# Patient Record
Sex: Male | Born: 1960
Health system: Southern US, Community
[De-identification: ages and names within clinical notes are randomized; demographics above are authoritative.]

## PROBLEM LIST (undated history)

## (undated) ENCOUNTER — Emergency Department: Admission: EM | Payer: BLUE CROSS/BLUE SHIELD

## (undated) DIAGNOSIS — M1990 Unspecified osteoarthritis, unspecified site: Secondary | ICD-10-CM

## (undated) DIAGNOSIS — E785 Hyperlipidemia, unspecified: Secondary | ICD-10-CM

## (undated) DIAGNOSIS — I10 Essential (primary) hypertension: Secondary | ICD-10-CM

## (undated) DIAGNOSIS — S838X1A Sprain of other specified parts of right knee, initial encounter: Secondary | ICD-10-CM

---

## 2004-08-30 ENCOUNTER — Emergency Department (HOSPITAL_COMMUNITY): Admission: EM | Admit: 2004-08-30 | Discharge: 2004-08-30 | Payer: Self-pay | Admitting: Family Medicine

## 2004-09-13 ENCOUNTER — Emergency Department (HOSPITAL_COMMUNITY): Admission: EM | Admit: 2004-09-13 | Discharge: 2004-09-13 | Payer: Self-pay | Admitting: Family Medicine

## 2012-02-02 ENCOUNTER — Emergency Department (HOSPITAL_COMMUNITY)
Admission: EM | Admit: 2012-02-02 | Discharge: 2012-02-02 | Disposition: A | Discharge status: Home / Self Care | Payer: BC Managed Care – PPO | Attending: Emergency Medicine | Admitting: Emergency Medicine

## 2012-02-02 ENCOUNTER — Encounter (HOSPITAL_COMMUNITY): Payer: Self-pay | Admitting: *Deleted

## 2012-02-02 DIAGNOSIS — M25569 Pain in unspecified knee: Secondary | ICD-10-CM

## 2012-02-02 MED ORDER — OXYCODONE-ACETAMINOPHEN 5-325 MG PO TABS: 2.0000 | ORAL_TABLET | ORAL | Status: AC | PRN

## 2012-02-02 MED ORDER — METHOCARBAMOL 500 MG PO TABS: 500.0000 mg | ORAL_TABLET | Freq: Two times a day (BID) | ORAL | Status: AC

## 2012-02-02 MED ORDER — OXYCODONE-ACETAMINOPHEN 5-325 MG PO TABS
1.0000 | ORAL_TABLET | Freq: Once | ORAL | Status: AC
  Administered 2012-02-02: 1 via ORAL
  Filled 2012-02-02: qty 1

## 2012-02-02 MED ORDER — DIAZEPAM 5 MG PO TABS
5.0000 mg | ORAL_TABLET | Freq: Once | ORAL | Status: AC
  Administered 2012-02-02: 5 mg via ORAL
  Filled 2012-02-02: qty 1

## 2012-02-02 MED ORDER — IBUPROFEN 800 MG PO TABS
800.0000 mg | ORAL_TABLET | Freq: Once | ORAL | Status: AC
  Administered 2012-02-02: 800 mg via ORAL
  Filled 2012-02-02: qty 1

## 2012-02-02 NOTE — ED Notes (Signed)
Gave patient ice pack to place on knee. 

## 2012-02-02 NOTE — ED Provider Notes (Signed)
History     CSN: [id number]  Arrival date & time 02/02/12  4098   First MD Initiated Contact with Patient 02/02/12 [phone]      Chief Complaint  Patient presents with  . Knee Pain    left    (Consider location/radiation/quality/duration/timing/severity/associated sxs/prior treatment) Patient is a 51 y.o. male presenting with knee pain. The history is provided by the patient.  Knee Pain   patient here with sudden onset of left knee pain which is atraumatic. Denies any injury, fever, or other joint involvement. Pain is sharp and localized to his left medial knee. Worse with standing and made better with remaining still. No prior history of same.  History reviewed. No pertinent past medical history.  History reviewed. No pertinent past surgical history.  No family history on file.  History  Substance Use Topics  . Smoking status: Not on file  . Smokeless tobacco: Not on file  . Alcohol Use: Yes     occasionally      Review of Systems  All other systems reviewed and are negative.    Allergies  Review of patient's allergies indicates no known allergies.  Home Medications  No current outpatient prescriptions on file.  BP 135/78  Pulse 109  Temp(Src) 98.1 F (36.7 C) (Oral)  Resp 14  Ht 5\' 9"  (1.753 m)  Wt 197 lb (89.359 kg)  BMI 29.09 kg/m2  SpO2 96%  Physical Exam  Nursing note and vitals reviewed. Constitutional: He is oriented to person, place, and time. He appears well-developed and well-nourished.  Non-toxic appearance.  HENT:  Head: Normocephalic and atraumatic.  Eyes: Conjunctivae are normal. Pupils are equal, round, and reactive to light.  Neck: Normal range of motion.  Cardiovascular: Normal rate.   Pulmonary/Chest: Effort normal.  Musculoskeletal:       Left knee without effusion or erythema. Not warm to the touch. Pain to palpation at medial collateral ligament. No evidence of a.c. l or PCl or laxity.  Neurological: He is alert and oriented to  person, place, and time.  Skin: Skin is warm and dry.  Psychiatric: He has a normal mood and affect.    ED Course  Procedures (including critical care time)  Labs Reviewed - No data to display No results found.   No diagnosis found.    MDM  Patient given pain medication feels better he's able to walk at this time he'll be discharged        Toy Baker, MD 02/02/12 [phone]

## 2012-02-02 NOTE — ED Notes (Signed)
Patient states "I feel better and would like to go home." Advised MD.

## 2012-02-02 NOTE — ED Notes (Addendum)
C/o left knee pain onset 2200 yesterday; denies injury.

## 2012-02-02 NOTE — Discharge Instructions (Signed)
Knee Pain The knee is the complex joint between your thigh and your lower leg. It is made up of bones, tendons, ligaments, and cartilage. The bones that make up the knee are:  The femur in the thigh.   The tibia and fibula in the lower leg.   The patella or kneecap riding in the groove on the lower femur.  CAUSES  Knee pain is a common complaint with many causes. A few of these causes are:  Injury, such as:   A ruptured ligament or tendon injury.   Torn cartilage.   Medical conditions, such as:   Gout   Arthritis   Infections   Overuse, over training or overdoing a physical activity.  Knee pain can be minor or severe. Knee pain can accompany debilitating injury. Minor knee problems often respond well to self-care measures or get well on their own. More serious injuries may need medical intervention or even surgery. SYMPTOMS The knee is complex. Symptoms of knee problems can vary widely. Some of the problems are:  Pain with movement and weight bearing.   Swelling and tenderness.   Buckling of the knee.   Inability to straighten or extend your knee.   Your knee locks and you cannot straighten it.   Warmth and redness with pain and fever.   Deformity or dislocation of the kneecap.  DIAGNOSIS  Determining what is wrong may be very straight forward such as when there is an injury. It can also be challenging because of the complexity of the knee. Tests to make a diagnosis may include:  Your caregiver taking a history and doing a physical exam.   Routine X-rays can be used to rule out other problems. X-rays will not reveal a cartilage tear. Some injuries of the knee can be diagnosed by:   Arthroscopy a surgical technique by which a small video camera is inserted through tiny incisions on the sides of the knee. This procedure is used to examine and repair internal knee joint problems. Tiny instruments can be used during arthroscopy to repair the torn knee cartilage  (meniscus).   Arthrography is a radiology technique. A contrast liquid is directly injected into the knee joint. Internal structures of the knee joint then become visible on X-ray film.   An MRI scan is a non x-ray radiology procedure in which magnetic fields and a computer produce two- or three-dimensional images of the inside of the knee. Cartilage tears are often visible using an MRI scanner. MRI scans have largely replaced arthrography in diagnosing cartilage tears of the knee.   Blood work.   Examination of the fluid that helps to lubricate the knee joint (synovial fluid). This is done by taking a sample out using a needle and a syringe.  TREATMENT The treatment of knee problems depends on the cause. Some of these treatments are:  Depending on the injury, proper casting, splinting, surgery or physical therapy care will be needed.   Give yourself adequate recovery time. Do not overuse your joints. If you begin to get sore during workout routines, back off. Slow down or do fewer repetitions.   For repetitive activities such as cycling or running, maintain your strength and nutrition.   Alternate muscle groups. For example if you are a weight lifter, work the upper body on one day and the lower body the next.   Either tight or weak muscles do not give the proper support for your knee. Tight or weak muscles do not absorb the stress placed   on the knee joint. Keep the muscles surrounding the knee strong.   Take care of mechanical problems.   If you have flat feet, orthotics or special shoes may help. See your caregiver if you need help.   Arch supports, sometimes with wedges on the inner or outer aspect of the heel, can help. These can shift pressure away from the side of the knee most bothered by osteoarthritis.   A brace called an "unloader" brace also may be used to help ease the pressure on the most arthritic side of the knee.   If your caregiver has prescribed crutches, braces,  wraps or ice, use as directed. The acronym for this is PRICE. This means protection, rest, ice, compression and elevation.   Nonsteroidal anti-inflammatory drugs (NSAID's), can help relieve pain. But if taken immediately after an injury, they may actually increase swelling. Take NSAID's with food in your stomach. Stop them if you develop stomach problems. Do not take these if you have a history of ulcers, stomach pain or bleeding from the bowel. Do not take without your caregiver's approval if you have problems with fluid retention, heart failure, or kidney problems.   For ongoing knee problems, physical therapy may be helpful.   Glucosamine and chondroitin are over-the-counter dietary supplements. Both may help relieve the pain of osteoarthritis in the knee. These medicines are different from the usual anti-inflammatory drugs. Glucosamine may decrease the rate of cartilage destruction.   Injections of a corticosteroid drug into your knee joint may help reduce the symptoms of an arthritis flare-up. They may provide pain relief that lasts a few months. You may have to wait a few months between injections. The injections do have a small increased risk of infection, water retention and elevated blood sugar levels.   Hyaluronic acid injected into damaged joints may ease pain and provide lubrication. These injections may work by reducing inflammation. A series of shots may give relief for as long as 6 months.   Topical painkillers. Applying certain ointments to your skin may help relieve the pain and stiffness of osteoarthritis. Ask your pharmacist for suggestions. Many over the-counter products are approved for temporary relief of arthritis pain.   In some countries, doctors often prescribe topical NSAID's for relief of chronic conditions such as arthritis and tendinitis. A review of treatment with NSAID creams found that they worked as well as oral medications but without the serious side effects.    PREVENTION  Maintain a healthy weight. Extra pounds put more strain on your joints.   Get strong, stay limber. Weak muscles are a common cause of knee injuries. Stretching is important. Include flexibility exercises in your workouts.   Be smart about exercise. If you have osteoarthritis, chronic knee pain or recurring injuries, you may need to change the way you exercise. This does not mean you have to stop being active. If your knees ache after jogging or playing basketball, consider switching to swimming, water aerobics or other low-impact activities, at least for a few days a week. Sometimes limiting high-impact activities will provide relief.   Make sure your shoes fit well. Choose footwear that is right for your sport.   Protect your knees. Use the proper gear for knee-sensitive activities. Use kneepads when playing volleyball or laying carpet. Buckle your seat belt every time you drive. Most shattered kneecaps occur in car accidents.   Rest when you are tired.  SEEK MEDICAL CARE IF:  You have knee pain that is continual and does not   seem to be getting better.  SEEK IMMEDIATE MEDICAL CARE IF:  Your knee joint feels hot to the touch and you have a high fever. MAKE SURE YOU:   Understand these instructions.   Will watch your condition.   Will get help right away if you are not doing well or get worse.  Document Released: 09/05/2007 Document Revised: 10/28/2011 Document Reviewed: 09/05/2007 [employer] Patient Information 2012 [employer]. 

## 2012-09-14 ENCOUNTER — Ambulatory Visit (HOSPITAL_COMMUNITY)
Admission: RE | Admit: 2012-09-14 | Discharge: 2012-09-14 | Disposition: A | Discharge status: Home / Self Care | Payer: BC Managed Care – PPO | Source: Ambulatory Visit | Attending: Family Medicine | Admitting: Family Medicine

## 2012-09-14 ENCOUNTER — Other Ambulatory Visit (HOSPITAL_COMMUNITY): Payer: Self-pay | Admitting: Family Medicine

## 2012-09-14 DIAGNOSIS — M25579 Pain in unspecified ankle and joints of unspecified foot: Secondary | ICD-10-CM

## 2012-09-14 DIAGNOSIS — M79609 Pain in unspecified limb: Secondary | ICD-10-CM | POA: Insufficient documentation

## 2013-02-15 ENCOUNTER — Emergency Department (HOSPITAL_COMMUNITY): Payer: BC Managed Care – PPO

## 2013-02-15 ENCOUNTER — Encounter (HOSPITAL_COMMUNITY): Payer: Self-pay

## 2013-02-15 ENCOUNTER — Emergency Department (HOSPITAL_COMMUNITY)
Admission: EM | Admit: 2013-02-15 | Discharge: 2013-02-15 | Disposition: A | Discharge status: Home / Self Care | Payer: BC Managed Care – PPO | Attending: Emergency Medicine | Admitting: Emergency Medicine

## 2013-02-15 DIAGNOSIS — I10 Essential (primary) hypertension: Secondary | ICD-10-CM | POA: Insufficient documentation

## 2013-02-15 DIAGNOSIS — R079 Chest pain, unspecified: Secondary | ICD-10-CM

## 2013-02-15 HISTORY — DX: Essential (primary) hypertension: I10

## 2013-02-15 LAB — CBC WITH DIFFERENTIAL/PLATELET
Basophils Absolute: 0 10*3/uL (ref 0.0–0.1)
Basophils Relative: 0 % (ref 0–1)
Eosinophils Absolute: 0.1 10*3/uL (ref 0.0–0.7)
Eosinophils Relative: 2 % (ref 0–5)
HCT: 44.1 % (ref 39.0–52.0)
Hemoglobin: 15.1 g/dL (ref 13.0–17.0)
Lymphocytes Relative: 26 % (ref 12–46)
Lymphs Abs: 1.7 10*3/uL (ref 0.7–4.0)
MCH: 32 pg (ref 26.0–34.0)
MCHC: 34.2 g/dL (ref 30.0–36.0)
MCV: 93.4 fL (ref 78.0–100.0)
Monocytes Absolute: 0.3 10*3/uL (ref 0.1–1.0)
Monocytes Relative: 5 % (ref 3–12)
Neutro Abs: 4.6 10*3/uL (ref 1.7–7.7)
Neutrophils Relative %: 67 % (ref 43–77)
Platelets: ADEQUATE 10*3/uL (ref 150–400)
RBC: 4.72 MIL/uL (ref 4.22–5.81)
RDW: 13 % (ref 11.5–15.5)
WBC: 6.7 10*3/uL (ref 4.0–10.5)

## 2013-02-15 LAB — BASIC METABOLIC PANEL
BUN: 17 mg/dL (ref 6–23)
CO2: 26 mEq/L (ref 19–32)
Calcium: 9.4 mg/dL (ref 8.4–10.5)
Chloride: 100 mEq/L (ref 96–112)
Creatinine, Ser: 0.78 mg/dL (ref 0.50–1.35)
GFR calc Af Amer: >90 mL/min (ref >90)
GFR calc non Af Amer: >90 mL/min (ref >90)
Glucose, Bld: 109 mg/dL — ABNORMAL HIGH (ref 70–99)
Potassium: 4.1 mEq/L (ref 3.5–5.1)
Sodium: 136 mEq/L (ref 135–145)

## 2013-02-15 LAB — TROPONIN I: Troponin I: <0.3 ng/mL (ref <0.30)

## 2013-02-15 NOTE — ED Notes (Signed)
Pt stated that he was given given 4 aspirin and 3 sl ntg, pta by dr. Isidore Moos approx 5 min pta.

## 2013-02-15 NOTE — ED Provider Notes (Signed)
History  This chart was scribed for Mason Hutching, MD by Bennett Scrape, ED Scribe. This patient was seen in room [facility]06/[facility] and the patient's care was started at 4:00 PM.  CSN: [id number]  Arrival date & time 02/15/13  1548   First MD Initiated Contact with Patient 02/15/13 1600      Chief Complaint  Patient presents with  . Chest Pain     The history is provided by the patient. No language interpreter was used.    Mason Patterson is a 52 y.o. male who presents to the Emergency Department complaining of 3 days of gradual onset, gradually worsening, waxing and waning left lateral CP described as a soreness. He states that he played basketball 6 days ago and he thinks that. He reports that he walks for exercise but denies any recent changes in his exercise regime.He states that the pain is worse with palpation and movement of the left arm. He was sent by Dr. Phillips Odor after being evaluated in office today. He was given 4 ASA and 3 SL NTG PTA. He has a h/o HTN He denies having a h/o HLD and DM. Mother had a pacemaker placed at the age of 94 but he denies any other family h/o cardiac problems. He denies having any prior stress test or cardiac catheterizations. He denies all other associated symptoms including SOB, diaphoresis, nausea, emesis, fever and cough. He is an occasional alcohol user but denies smoking.   Past Medical History  Diagnosis Date  . Hypertension     History reviewed. No pertinent past surgical history.  No family history on file.  History  Substance Use Topics  . Smoking status: Never Smoker   . Smokeless tobacco: Not on file  . Alcohol Use: Yes     Comment: occasionally      Review of Systems  A complete 10 system review of systems was obtained and all systems are negative except as noted in the HPI and PMH.   Allergies  Review of patient's allergies indicates no known allergies.  Home Medications  No current outpatient prescriptions on  file.  Triage Vitals: BP 161/87  Pulse 97  Temp(Src) 97.9 F (36.6 C) (Oral)  Resp 20  Ht 5\' 9"  (1.753 m)  Wt 204 lb (92.534 kg)  BMI 30.11 kg/m2  SpO2 98%  Physical Exam  Nursing note and vitals reviewed. Constitutional: He is oriented to person, place, and time. He appears well-developed and well-nourished.  HENT:  Head: Normocephalic and atraumatic.  Eyes: Conjunctivae and EOM are normal. Pupils are equal, round, and reactive to light.  Neck: Normal range of motion. Neck supple.  Cardiovascular: Normal rate, regular rhythm and normal heart sounds.   Pulmonary/Chest: Effort normal and breath sounds normal.  Abdominal: Soft. Bowel sounds are normal.  Musculoskeletal: Normal range of motion.  Neurological: He is alert and oriented to person, place, and time.  Skin: Skin is warm and dry.  Psychiatric: He has a normal mood and affect.    ED Course  Procedures (including critical care time)  DIAGNOSTIC STUDIES: Oxygen Saturation is 98% on room air, normal by my interpretation.    COORDINATION OF CARE: 4:30 PM-Advised pt of his risk factors and informed pt that his symptoms do not appear to be heart related. Discussed treatment plan which includes CXR, troponin and CBC panel with pt at bedside and pt agreed to plan. Pt states that his PCP is going to set up a stress test and will refer to a  Cardiologist.  Labs Reviewed  BASIC METABOLIC PANEL - Abnormal; Notable for the following:    Glucose, Bld 109 (*)    All other components within normal limits  CBC WITH DIFFERENTIAL  TROPONIN I   Dg Chest Port 1 View  02/15/2013  *RADIOLOGY REPORT*  Clinical Data: Chest pain.  PORTABLE CHEST - 1 VIEW  Comparison: 01/07/2010.  Findings: The heart is mildly enlarged but stable.  The mediastinal and hilar contours are unchanged.  Slight prominence in the azygos region could be a slightly dilated azygos vein.  Low lung volumes with vascular crowding and basilar atelectasis.  No edema or  effusions.  The bony thorax is intact.  IMPRESSION:  1.  Low lung volumes with vascular crowding and basilar atelectasis. 2.  Borderline cardiac enlargement. 3.  Fullness in the azygos region could be a dilated azygos vein or an azygos node.  A repeat two-view chest when able is suggested.   Original Report Authenticated By: Rudie Meyer, M.D.      No diagnosis found.   Date: 02/15/2013  Rate: 98  Rhythm: normal sinus rhythm  QRS Axis: normal  Intervals: normal  ST/T Wave abnormalities: normal  Conduction Disutrbances: none  Narrative Interpretation: unremarkable      MDM  History and physical atypical for cardiac related chest pain. Screening tests normal. Cardiac risk factor profile low.   normal vital signs.      I personally performed the services described in this documentation, which was scribed in my presence. The recorded information has been reviewed and is accurate.    Mason Hutching, MD 02/15/13 [phone]

## 2013-02-15 NOTE — ED Notes (Signed)
Pt sent by dr. Lamar Blinks office chest pain for 3 days off/on, tender to palpation. Denies any n/v/d or fever, no cough, no sob.

## 2013-02-21 ENCOUNTER — Encounter (INDEPENDENT_AMBULATORY_CARE_PROVIDER_SITE_OTHER): Payer: Self-pay | Admitting: *Deleted

## 2013-12-31 ENCOUNTER — Other Ambulatory Visit (HOSPITAL_COMMUNITY): Payer: Self-pay | Admitting: Family Medicine

## 2013-12-31 ENCOUNTER — Ambulatory Visit (HOSPITAL_COMMUNITY)
Admission: RE | Admit: 2013-12-31 | Discharge: 2013-12-31 | Disposition: A | Discharge status: Home / Self Care | Payer: BC Managed Care – PPO | Source: Ambulatory Visit | Attending: Family Medicine | Admitting: Family Medicine

## 2013-12-31 DIAGNOSIS — I10 Essential (primary) hypertension: Secondary | ICD-10-CM

## 2013-12-31 DIAGNOSIS — R079 Chest pain, unspecified: Secondary | ICD-10-CM

## 2014-06-10 ENCOUNTER — Other Ambulatory Visit (HOSPITAL_COMMUNITY): Payer: Self-pay | Admitting: Family Medicine

## 2014-06-10 DIAGNOSIS — N63 Unspecified lump in unspecified breast: Secondary | ICD-10-CM

## 2014-06-11 ENCOUNTER — Ambulatory Visit (HOSPITAL_COMMUNITY): Payer: BC Managed Care – PPO

## 2014-06-18 ENCOUNTER — Ambulatory Visit (HOSPITAL_COMMUNITY)
Admission: RE | Admit: 2014-06-18 | Discharge: 2014-06-18 | Disposition: A | Discharge status: Home / Self Care | Payer: BC Managed Care – PPO | Source: Ambulatory Visit | Attending: Family Medicine | Admitting: Family Medicine

## 2014-06-18 DIAGNOSIS — N63 Unspecified lump in unspecified breast: Secondary | ICD-10-CM | POA: Insufficient documentation

## 2014-06-18 DIAGNOSIS — N644 Mastodynia: Secondary | ICD-10-CM | POA: Insufficient documentation

## 2015-07-12 ENCOUNTER — Emergency Department (HOSPITAL_COMMUNITY)
Admission: EM | Admit: 2015-07-12 | Discharge: 2015-07-12 | Disposition: A | Discharge status: Home / Self Care | Payer: BLUE CROSS/BLUE SHIELD | Attending: Emergency Medicine | Admitting: Emergency Medicine

## 2015-07-12 ENCOUNTER — Encounter (HOSPITAL_COMMUNITY): Payer: Self-pay | Admitting: Emergency Medicine

## 2015-07-12 DIAGNOSIS — Z79899 Other long term (current) drug therapy: Secondary | ICD-10-CM | POA: Insufficient documentation

## 2015-07-12 DIAGNOSIS — R000 Tachycardia, unspecified: Secondary | ICD-10-CM | POA: Insufficient documentation

## 2015-07-12 DIAGNOSIS — M1990 Unspecified osteoarthritis, unspecified site: Secondary | ICD-10-CM | POA: Insufficient documentation

## 2015-07-12 DIAGNOSIS — Z7982 Long term (current) use of aspirin: Secondary | ICD-10-CM | POA: Diagnosis not present

## 2015-07-12 DIAGNOSIS — M25562 Pain in left knee: Secondary | ICD-10-CM | POA: Diagnosis present

## 2015-07-12 DIAGNOSIS — I10 Essential (primary) hypertension: Secondary | ICD-10-CM | POA: Diagnosis not present

## 2015-07-12 HISTORY — DX: Unspecified osteoarthritis, unspecified site: M19.90

## 2015-07-12 MED ORDER — OXYCODONE-ACETAMINOPHEN 5-325 MG PO TABS: 1.0000 | ORAL_TABLET | Freq: Three times a day (TID) | ORAL | Status: DC | PRN

## 2015-07-12 MED ORDER — IBUPROFEN 400 MG PO TABS: 400.0000 mg | ORAL_TABLET | Freq: Three times a day (TID) | ORAL | Status: AC

## 2015-07-12 NOTE — ED Provider Notes (Signed)
CSN: [id number]     Arrival date & time 07/12/15  1503 History   First MD Initiated Contact with Patient 07/12/15 1512     Chief Complaint  Patient presents with  . Knee Pain     (Consider location/radiation/quality/duration/timing/severity/associated sxs/prior Treatment) Patient is a 54 y.o. male presenting with knee pain.  Knee Pain Location:  Knee Associated symptoms: no fever    54 year old male with a history of osteoporosis presents to the emergency department today for acute exacerbation of left knee pain for the last 2 days. Feel like it's sharp in nature. Not swollen, erythematous, no fever, or other systemic symptoms. No other associated symptoms. Just hurts worse with walking on it. No trauma, recent illnesses.  Past Medical History  Diagnosis Date  . Hypertension   . Arthritis    History reviewed. No pertinent past surgical history. No family history on file. Social History  Substance Use Topics  . Smoking status: Never Smoker   . Smokeless tobacco: None  . Alcohol Use: Yes     Comment: occasionally    Review of Systems  Constitutional: Negative for fever.  Musculoskeletal: Positive for gait problem ( difficult to walk in on his left knee ). Negative for joint swelling.  Skin: Negative for rash and wound.  All other systems reviewed and are negative.     Allergies  Darvocet and Strawberry  Home Medications   Prior to Admission medications   Medication Sig Start Date End Date Taking? Authorizing Provider  aspirin EC 81 MG tablet Take 243 mg by mouth once as needed for pain.    Historical Provider, MD  ibuprofen (ADVIL,MOTRIN) 400 MG tablet Take 1 tablet (400 mg total) by mouth 3 (three) times daily. 07/12/15 07/19/15  Marily Memos, MD  losartan (COZAAR) 50 MG tablet Take 50 mg by mouth daily.    Historical Provider, MD  nitroGLYCERIN (NITROSTAT) 0.4 MG SL tablet Place 0.4 mg under the tongue once as needed for chest pain.    Historical Provider, MD   oxyCODONE-acetaminophen (PERCOCET) 5-325 MG per tablet Take 1 tablet by mouth every 8 (eight) hours as needed for severe pain. 07/12/15   Marily Memos, MD   BP 174/99 mmHg  Pulse 119  Temp(Src) 97.4 F (36.3 C) (Oral)  Resp 16  Ht 5' 9.5" (1.765 m)  Wt 210 lb (95.255 kg)  BMI 30.58 kg/m2  SpO2 100% Physical Exam  Constitutional: He appears well-developed and well-nourished.  Cardiovascular: Regular rhythm.   Initially tachycardic however on my exam patient heart is in the 90s, suspect this is secondary to pain.  Pulmonary/Chest: Effort normal and breath sounds normal.  Musculoskeletal: Normal range of motion. He exhibits no edema or tenderness.  No erythema, edema, pain with palpation or pain with range of motion of left knee. No evidence of trauma.  Nursing note and vitals reviewed.   ED Course  Procedures (including critical care time) Labs Review Labs Reviewed - No data to display  Imaging Review No results found. I have personally reviewed and evaluated these images and lab results as part of my medical decision-making.   EKG Interpretation None      MDM   Final diagnoses:  Left knee pain   Acute exacerbation of nontraumatic left knee pain. No evidence for septic knee. No history of gout or other causes for symptoms. Short course pain medication provided and will follow-up with his orthopedic physician.  I have personally and contemperaneously reviewed labs and imaging and used in my  decision making as above.   A medical screening exam was performed and I feel the patient has had an appropriate workup for their chief complaint at this time and likelihood of emergent condition existing is low. They have been counseled on decision, discharge, follow up and which symptoms necessitate immediate return to the emergency department. They or their family verbally stated understanding and agreement with plan and discharged in stable condition.      Marily Memos,  MD 07/12/15 [phone]

## 2015-07-12 NOTE — ED Notes (Addendum)
Patient states he has bilateral knee pain that got worse today. Denies injury. Patient has been told before that he has arthritis.

## 2015-07-12 NOTE — ED Notes (Signed)
Pt c/o bilateral knee pain that has progressively gotten worse over the past few days, says the pain is worse when walking and more so in the left knee than the right. Pt states he has been told he has arthritis but it has "never hurt like this". Pt denies weakness in his legs.

## 2015-08-08 ENCOUNTER — Encounter (HOSPITAL_BASED_OUTPATIENT_CLINIC_OR_DEPARTMENT_OTHER): Payer: Self-pay | Admitting: *Deleted

## 2015-08-08 ENCOUNTER — Other Ambulatory Visit: Payer: Self-pay | Admitting: Orthopaedic Surgery

## 2015-08-13 ENCOUNTER — Encounter (HOSPITAL_BASED_OUTPATIENT_CLINIC_OR_DEPARTMENT_OTHER)
Admission: RE | Admit: 2015-08-13 | Discharge: 2015-08-13 | Disposition: A | Discharge status: Home / Self Care | Payer: BLUE CROSS/BLUE SHIELD | Source: Ambulatory Visit | Attending: Orthopaedic Surgery | Admitting: Orthopaedic Surgery

## 2015-08-13 DIAGNOSIS — M23261 Derangement of other lateral meniscus due to old tear or injury, right knee: Secondary | ICD-10-CM | POA: Diagnosis not present

## 2015-08-13 DIAGNOSIS — M23231 Derangement of other medial meniscus due to old tear or injury, right knee: Secondary | ICD-10-CM | POA: Diagnosis not present

## 2015-08-13 DIAGNOSIS — I10 Essential (primary) hypertension: Secondary | ICD-10-CM | POA: Diagnosis not present

## 2015-08-13 DIAGNOSIS — M23207 Derangement of unspecified meniscus due to old tear or injury, left knee: Secondary | ICD-10-CM | POA: Diagnosis not present

## 2015-08-13 DIAGNOSIS — E785 Hyperlipidemia, unspecified: Secondary | ICD-10-CM | POA: Diagnosis not present

## 2015-08-13 DIAGNOSIS — Z885 Allergy status to narcotic agent status: Secondary | ICD-10-CM | POA: Diagnosis not present

## 2015-08-13 DIAGNOSIS — E669 Obesity, unspecified: Secondary | ICD-10-CM | POA: Diagnosis not present

## 2015-08-13 DIAGNOSIS — Z91018 Allergy to other foods: Secondary | ICD-10-CM | POA: Diagnosis not present

## 2015-08-13 DIAGNOSIS — Z6829 Body mass index (BMI) 29.0-29.9, adult: Secondary | ICD-10-CM | POA: Diagnosis not present

## 2015-08-13 DIAGNOSIS — M1990 Unspecified osteoarthritis, unspecified site: Secondary | ICD-10-CM | POA: Diagnosis not present

## 2015-08-13 NOTE — Pre-Procedure Instructions (Signed)
EKG reviewed with Dr Crews. OK to proceed 

## 2015-08-13 NOTE — H&P (Signed)
[name] is an 54 y.o. male.   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 He is not working at this point due to some knee pain right greater than left. He has been seeing Dr Althea Charon and has had an injection on the left side which has helped somewhat. He persists with some terrible pain on the right and a locking sensation in that knee. The left-sided pain is just mild at this point. He cannot workout due to the discomfort. This does wake him from sleep. The pain is intermittent and severe.    MRI:  I reviewed MRI scans films and reports of a studies done at the [facility] 08/05/15. He has impressive medial meniscus tears on both sides with minimal degenerative change.  Past Medical History  Diagnosis Date  . Hypertension   . Arthritis   . Hyperlipidemia   . Acute medial meniscal injury of right knee     History reviewed. No pertinent past surgical history.  History reviewed. No pertinent family history. Social History:  reports that he has never smoked. He does not have any smokeless tobacco history on file. He reports that he does not drink alcohol or use illicit drugs.  Allergies:  Allergies  Allergen Reactions  . Darvocet [Propoxyphene N-Acetaminophen] Other (See Comments)    REACTION IS UNKNOWN  . Strawberry Rash    No prescriptions prior to admission    No results found for this or any previous visit (from the past 48 hour(s)). No results found.  Review of Systems  Musculoskeletal: Positive for joint pain.       Right knee  All other systems reviewed and are negative.   Height  (1.753 m), weight 92.987 kg (205 lb). Physical Exam  Constitutional: He is oriented to person, place, and time. He appears well-developed and well-nourished.  HENT:  Head: Normocephalic and atraumatic.  Eyes: Pupils are equal, round, and reactive to light.  Neck: Normal range of motion.  Cardiovascular:  Normal rate and regular rhythm.   Respiratory: Effort normal.  GI: Soft.  Musculoskeletal:  Right knee has trace effusion. His motion is 0-130 at which point he has some terrible pain. He has intense medial joint line pain and a very impressive McMurray's test in the medial direction. Opposite knee has a less impressive McMurray's test again with pain in the medial direction. I do not feel an effusion on the left side. Don't feel any crepitation of either knee. Hip motion is full and straight leg raise is negative. Sensation and motor function are intact in his feet with palpable pulses on both sides.  Neurological: He is alert and oriented to person, place, and time.  Skin: Skin is warm and dry.  Psychiatric: He has a normal mood and affect. His behavior is normal. Judgment and thought content normal.     Assessment/Plan Right knee torn medial meniscus by MRI  Plan: [name] has impressive medial meniscus tears on both sides. The right knee is most symptomatic. He thinks he can get by with the left knee issue. We discussed risk of anesthesia, infection, DVT related to a right knee arthroscopy. I told him he'll be on crutches for a few days after this outpatient procedure. He will require some physical therapy for recovery.   NIDA, Ginger Organ 08/13/2015, 12:52 PM

## 2015-08-14 ENCOUNTER — Encounter (HOSPITAL_BASED_OUTPATIENT_CLINIC_OR_DEPARTMENT_OTHER): Payer: Self-pay | Admitting: *Deleted

## 2015-08-14 ENCOUNTER — Encounter (HOSPITAL_BASED_OUTPATIENT_CLINIC_OR_DEPARTMENT_OTHER)
Admission: RE | Disposition: A | Discharge status: Home / Self Care | Payer: Self-pay | Source: Ambulatory Visit | Attending: Orthopaedic Surgery

## 2015-08-14 ENCOUNTER — Ambulatory Visit (HOSPITAL_BASED_OUTPATIENT_CLINIC_OR_DEPARTMENT_OTHER)
Admission: RE | Admit: 2015-08-14 | Discharge: 2015-08-14 | Disposition: A | Discharge status: Home / Self Care | Payer: BLUE CROSS/BLUE SHIELD | Source: Ambulatory Visit | Attending: Orthopaedic Surgery | Admitting: Orthopaedic Surgery

## 2015-08-14 ENCOUNTER — Ambulatory Visit (HOSPITAL_BASED_OUTPATIENT_CLINIC_OR_DEPARTMENT_OTHER): Payer: BLUE CROSS/BLUE SHIELD | Admitting: Anesthesiology

## 2015-08-14 DIAGNOSIS — M1990 Unspecified osteoarthritis, unspecified site: Secondary | ICD-10-CM | POA: Insufficient documentation

## 2015-08-14 DIAGNOSIS — Z91018 Allergy to other foods: Secondary | ICD-10-CM | POA: Insufficient documentation

## 2015-08-14 DIAGNOSIS — Z885 Allergy status to narcotic agent status: Secondary | ICD-10-CM | POA: Insufficient documentation

## 2015-08-14 DIAGNOSIS — M23231 Derangement of other medial meniscus due to old tear or injury, right knee: Secondary | ICD-10-CM | POA: Insufficient documentation

## 2015-08-14 DIAGNOSIS — I10 Essential (primary) hypertension: Secondary | ICD-10-CM | POA: Insufficient documentation

## 2015-08-14 DIAGNOSIS — E669 Obesity, unspecified: Secondary | ICD-10-CM | POA: Insufficient documentation

## 2015-08-14 DIAGNOSIS — M23261 Derangement of other lateral meniscus due to old tear or injury, right knee: Secondary | ICD-10-CM | POA: Insufficient documentation

## 2015-08-14 DIAGNOSIS — E785 Hyperlipidemia, unspecified: Secondary | ICD-10-CM | POA: Insufficient documentation

## 2015-08-14 DIAGNOSIS — M23207 Derangement of unspecified meniscus due to old tear or injury, left knee: Secondary | ICD-10-CM | POA: Insufficient documentation

## 2015-08-14 DIAGNOSIS — Z6829 Body mass index (BMI) 29.0-29.9, adult: Secondary | ICD-10-CM | POA: Insufficient documentation

## 2015-08-14 HISTORY — DX: Hyperlipidemia, unspecified: E78.5

## 2015-08-14 HISTORY — PX: CHONDROPLASTY: SHX5177

## 2015-08-14 HISTORY — PX: KNEE ARTHROSCOPY WITH MEDIAL MENISECTOMY: SHX5651

## 2015-08-14 HISTORY — DX: Sprain of other specified parts of right knee, initial encounter: S83.8X1A

## 2015-08-14 SURGERY — ARTHROSCOPY, KNEE, WITH MEDIAL MENISCECTOMY
Anesthesia: General | Site: Knee | Laterality: Right

## 2015-08-14 MED ORDER — MIDAZOLAM HCL 2 MG/2ML IJ SOLN
1.0000 mg | INTRAMUSCULAR | Status: DC | PRN
  Administered 2015-08-14: 2 mg via INTRAVENOUS

## 2015-08-14 MED ORDER — PHENYLEPHRINE HCL 10 MG/ML IJ SOLN
INTRAMUSCULAR | Status: DC | PRN
  Administered 2015-08-14 (×2): 40 ug via INTRAVENOUS

## 2015-08-14 MED ORDER — GLYCOPYRROLATE 0.2 MG/ML IJ SOLN
0.2000 mg | Freq: Once | INTRAMUSCULAR | Status: DC | PRN
Start: 2015-08-14 — End: 2015-08-14

## 2015-08-14 MED ORDER — CEFAZOLIN SODIUM-DEXTROSE 2-3 GM-% IV SOLR
INTRAVENOUS | Status: AC
  Filled 2015-08-14: qty 50

## 2015-08-14 MED ORDER — LIDOCAINE HCL (CARDIAC) 20 MG/ML IV SOLN
INTRAVENOUS | Status: DC | PRN
  Administered 2015-08-14: 50 mg via INTRAVENOUS

## 2015-08-14 MED ORDER — EPHEDRINE SULFATE 50 MG/ML IJ SOLN
INTRAMUSCULAR | Status: AC
  Filled 2015-08-14: qty 1

## 2015-08-14 MED ORDER — PROPOFOL 10 MG/ML IV BOLUS
INTRAVENOUS | Status: DC | PRN
  Administered 2015-08-14: 200 mg via INTRAVENOUS

## 2015-08-14 MED ORDER — OXYCODONE HCL 5 MG PO TABS: 5.0000 mg | ORAL_TABLET | Freq: Once | ORAL | Status: DC | PRN

## 2015-08-14 MED ORDER — KETOROLAC TROMETHAMINE 30 MG/ML IJ SOLN
INTRAMUSCULAR | Status: AC
  Filled 2015-08-14: qty 1

## 2015-08-14 MED ORDER — PHENYLEPHRINE 40 MCG/ML (10ML) SYRINGE FOR IV PUSH (FOR BLOOD PRESSURE SUPPORT)
PREFILLED_SYRINGE | INTRAVENOUS | Status: AC
  Filled 2015-08-14: qty 10

## 2015-08-14 MED ORDER — MIDAZOLAM HCL 2 MG/2ML IJ SOLN
INTRAMUSCULAR | Status: AC
  Filled 2015-08-14: qty 4

## 2015-08-14 MED ORDER — SCOPOLAMINE 1 MG/3DAYS TD PT72
1.0000 | MEDICATED_PATCH | Freq: Once | TRANSDERMAL | Status: DC | PRN
Start: 2015-08-14 — End: 2015-08-14

## 2015-08-14 MED ORDER — ONDANSETRON HCL 4 MG/2ML IJ SOLN
INTRAMUSCULAR | Status: AC
  Filled 2015-08-14: qty 2

## 2015-08-14 MED ORDER — BUPIVACAINE-EPINEPHRINE (PF) 0.5% -1:200000 IJ SOLN
INTRAMUSCULAR | Status: AC
  Filled 2015-08-14: qty 30

## 2015-08-14 MED ORDER — OXYCODONE HCL 5 MG/5ML PO SOLN: 5.0000 mg | Freq: Once | ORAL | Status: DC | PRN

## 2015-08-14 MED ORDER — MORPHINE SULFATE (PF) 4 MG/ML IV SOLN
INTRAVENOUS | Status: DC | PRN
  Administered 2015-08-14: 4 mg

## 2015-08-14 MED ORDER — LACTATED RINGERS IV SOLN
INTRAVENOUS | Status: DC
  Administered 2015-08-14: 14:00:00 via INTRAVENOUS

## 2015-08-14 MED ORDER — DEXAMETHASONE SODIUM PHOSPHATE 10 MG/ML IJ SOLN
INTRAMUSCULAR | Status: AC
  Filled 2015-08-14: qty 1

## 2015-08-14 MED ORDER — DEXAMETHASONE SODIUM PHOSPHATE 4 MG/ML IJ SOLN
INTRAMUSCULAR | Status: DC | PRN
  Administered 2015-08-14: 10 mg via INTRAVENOUS

## 2015-08-14 MED ORDER — METHYLPREDNISOLONE ACETATE 80 MG/ML IJ SUSP
INTRAMUSCULAR | Status: DC | PRN
  Administered 2015-08-14 (×2): 80 mg via INTRA_ARTICULAR

## 2015-08-14 MED ORDER — SODIUM CHLORIDE 0.9 % IR SOLN
Status: DC | PRN
  Administered 2015-08-14: 3000 mL

## 2015-08-14 MED ORDER — FENTANYL CITRATE (PF) 100 MCG/2ML IJ SOLN
INTRAMUSCULAR | Status: AC
  Filled 2015-08-14: qty 2

## 2015-08-14 MED ORDER — HYDROCODONE-ACETAMINOPHEN 5-325 MG PO TABS: 1.0000 | ORAL_TABLET | Freq: Four times a day (QID) | ORAL | Status: DC | PRN

## 2015-08-14 MED ORDER — BUPIVACAINE-EPINEPHRINE 0.5% -1:200000 IJ SOLN
INTRAMUSCULAR | Status: DC | PRN
  Administered 2015-08-14: 5 mL
  Administered 2015-08-14: 20 mL

## 2015-08-14 MED ORDER — CEFAZOLIN SODIUM-DEXTROSE 2-3 GM-% IV SOLR
2.0000 g | INTRAVENOUS | Status: AC
  Administered 2015-08-14: 2 g via INTRAVENOUS

## 2015-08-14 MED ORDER — MORPHINE SULFATE (PF) 4 MG/ML IV SOLN
INTRAVENOUS | Status: AC
  Filled 2015-08-14: qty 1

## 2015-08-14 MED ORDER — EPHEDRINE SULFATE 50 MG/ML IJ SOLN
INTRAMUSCULAR | Status: DC | PRN
  Administered 2015-08-14 (×2): 10 mg via INTRAVENOUS

## 2015-08-14 MED ORDER — PROPOFOL 10 MG/ML IV BOLUS
INTRAVENOUS | Status: AC
  Filled 2015-08-14: qty 20

## 2015-08-14 MED ORDER — LIDOCAINE HCL (CARDIAC) 20 MG/ML IV SOLN
INTRAVENOUS | Status: AC
  Filled 2015-08-14: qty 5

## 2015-08-14 MED ORDER — CHLORHEXIDINE GLUCONATE 4 % EX LIQD: 60.0000 mL | Freq: Once | CUTANEOUS | Status: DC

## 2015-08-14 MED ORDER — FENTANYL CITRATE (PF) 100 MCG/2ML IJ SOLN
50.0000 ug | INTRAMUSCULAR | Status: DC | PRN
  Administered 2015-08-14: 100 ug via INTRAVENOUS

## 2015-08-14 MED ORDER — FENTANYL CITRATE (PF) 100 MCG/2ML IJ SOLN
25.0000 ug | INTRAMUSCULAR | Status: DC | PRN
  Administered 2015-08-14: 50 ug via INTRAVENOUS
  Administered 2015-08-14 (×2): 25 ug via INTRAVENOUS

## 2015-08-14 MED ORDER — FENTANYL CITRATE (PF) 100 MCG/2ML IJ SOLN
INTRAMUSCULAR | Status: AC
  Filled 2015-08-14: qty 4

## 2015-08-14 MED ORDER — PROMETHAZINE HCL 25 MG/ML IJ SOLN: 6.2500 mg | INTRAMUSCULAR | Status: DC | PRN

## 2015-08-14 MED ORDER — LACTATED RINGERS IV SOLN: INTRAVENOUS | Status: DC

## 2015-08-14 SURGICAL SUPPLY — 35 items
BANDAGE ELASTIC 6 VELCRO ST LF (GAUZE/BANDAGES/DRESSINGS) ×2 IMPLANT
BLADE CUDA 5.5 (BLADE) IMPLANT
BLADE CUTTER GATOR 3.5 (BLADE) ×2 IMPLANT
BLADE GREAT WHITE 4.2 (BLADE) IMPLANT
BNDG GAUZE ELAST 4 BULKY (GAUZE/BANDAGES/DRESSINGS) ×2 IMPLANT
DRAPE ARTHROSCOPY W/POUCH 114 (DRAPES) ×2 IMPLANT
DRAPE U-SHAPE 47X51 STRL (DRAPES) ×2 IMPLANT
DRSG EMULSION OIL 3X3 NADH (GAUZE/BANDAGES/DRESSINGS) ×2 IMPLANT
DURAPREP 26ML APPLICATOR (WOUND CARE) ×2 IMPLANT
ELECT MENISCUS 165MM 90D (ELECTRODE) IMPLANT
ELECT REM PT RETURN 9FT ADLT (ELECTROSURGICAL)
ELECTRODE REM PT RTRN 9FT ADLT (ELECTROSURGICAL) IMPLANT
GAUZE SPONGE 4X4 12PLY STRL (GAUZE/BANDAGES/DRESSINGS) ×2 IMPLANT
GLOVE BIO SURGEON STRL SZ8 (GLOVE) ×4 IMPLANT
GLOVE BIOGEL PI IND STRL 8 (GLOVE) ×2 IMPLANT
GLOVE BIOGEL PI INDICATOR 8 (GLOVE) ×2
GOWN STRL REUS W/ TWL LRG LVL3 (GOWN DISPOSABLE) ×1 IMPLANT
GOWN STRL REUS W/ TWL XL LVL3 (GOWN DISPOSABLE) ×2 IMPLANT
GOWN STRL REUS W/TWL LRG LVL3 (GOWN DISPOSABLE) ×2
GOWN STRL REUS W/TWL XL LVL3 (GOWN DISPOSABLE) ×4
IV NS IRRIG 3000ML ARTHROMATIC (IV SOLUTION) IMPLANT
KNEE WRAP E Z 3 GEL PACK (MISCELLANEOUS) ×2 IMPLANT
MANIFOLD NEPTUNE II (INSTRUMENTS) IMPLANT
PACK ARTHROSCOPY DSU (CUSTOM PROCEDURE TRAY) ×2 IMPLANT
PACK BASIN DAY SURGERY FS (CUSTOM PROCEDURE TRAY) ×2 IMPLANT
PENCIL BUTTON HOLSTER BLD 10FT (ELECTRODE) IMPLANT
SET ARTHROSCOPY TUBING (MISCELLANEOUS) ×2
SET ARTHROSCOPY TUBING LN (MISCELLANEOUS) ×1 IMPLANT
SHEET MEDIUM DRAPE 40X70 STRL (DRAPES) ×2 IMPLANT
SYR 3ML 18GX1 1/2 (SYRINGE) IMPLANT
TOWEL OR 17X24 6PK STRL BLUE (TOWEL DISPOSABLE) ×2 IMPLANT
TOWEL OR NON WOVEN STRL DISP B (DISPOSABLE) ×2 IMPLANT
WAND 30 DEG SABER W/CORD (SURGICAL WAND) IMPLANT
WAND STAR VAC 90 (SURGICAL WAND) IMPLANT
WATER STERILE IRR 1000ML POUR (IV SOLUTION) ×2 IMPLANT

## 2015-08-14 NOTE — Anesthesia Preprocedure Evaluation (Signed)
Anesthesia Evaluation  Patient identified by MRN, date of birth, ID band Patient awake    Reviewed: Allergy & Precautions, H&P , NPO status , Patient's Chart, lab work & pertinent test results  History of Anesthesia Complications Negative for: history of anesthetic complications  Airway Mallampati: II  TM Distance: >3 FB Neck ROM: full    Dental no notable dental hx.    Pulmonary neg pulmonary ROS,    Pulmonary exam normal breath sounds clear to auscultation       Cardiovascular hypertension, Pt. on medications Normal cardiovascular exam Rhythm:regular Rate:Normal     Neuro/Psych negative neurological ROS     GI/Hepatic negative GI ROS, Neg liver ROS,   Endo/Other  negative endocrine ROS  Renal/GU negative Renal ROS     Musculoskeletal  (+) Arthritis ,   Abdominal (+) + obese,   Peds  Hematology negative hematology ROS (+)   Anesthesia Other Findings Medial Meniscus tear  Reproductive/Obstetrics negative OB ROS                             Anesthesia Physical Anesthesia Plan  ASA: II  Anesthesia Plan: General   Post-op Pain Management:    Induction: Intravenous  Airway Management Planned: LMA  Additional Equipment:   Intra-op Plan:   Post-operative Plan: Extubation in OR  Informed Consent: I have reviewed the patients History and Physical, chart, labs and discussed the procedure including the risks, benefits and alternatives for the proposed anesthesia with the patient or authorized representative who has indicated his/her understanding and acceptance.   Dental Advisory Given  Plan Discussed with: Anesthesiologist, CRNA and Surgeon  Anesthesia Plan Comments:         Anesthesia Quick Evaluation

## 2015-08-14 NOTE — Transfer of Care (Signed)
Immediate Anesthesia Transfer of Care Note  Patient: Mason Patterson  Procedure(s) Performed: Procedure(s): ARTHROSCOPY RIGHT KNEE (Right)  Patient Location: PACU  Anesthesia Type:General  Level of Consciousness: sedated  Airway & Oxygen Therapy: Patient Spontanous Breathing and Patient connected to face mask oxygen  Post-op Assessment: Report given to RN and Post -op Vital signs reviewed and stable  Post vital signs: Reviewed and stable  Last Vitals:  Filed Vitals:   08/14/15 1356  BP: 137/74  Pulse: 84  Temp: 36.5 C  Resp: 20    Complications: No apparent anesthesia complications

## 2015-08-14 NOTE — Anesthesia Postprocedure Evaluation (Signed)
  Anesthesia Post-op Note  Patient: Mason Patterson  Procedure(s) Performed: Procedure(s) (LRB): KNEE ARTHROSCOPY WITH MEDIAL AND LATERAL MENISECTOMY (Right) CHONDROPLASTY (Right)  Patient Location: PACU  Anesthesia Type: General  Level of Consciousness: awake and alert   Airway and Oxygen Therapy: Patient Spontanous Breathing  Post-op Pain: mild  Post-op Assessment: Post-op Vital signs reviewed, Patient's Cardiovascular Status Stable, Respiratory Function Stable, Patent Airway and No signs of Nausea or vomiting  Last Vitals:  Filed Vitals:   08/14/15 1545  BP: 128/82  Pulse: 94  Temp:   Resp: 15    Post-op Vital Signs: stable   Complications: No apparent anesthesia complications

## 2015-08-14 NOTE — Interval H&P Note (Signed)
OK for surgery PD 

## 2015-08-14 NOTE — Anesthesia Procedure Notes (Signed)
Procedure Name: LMA Insertion Date/Time: 08/14/2015 2:50 PM Performed by: Caren Macadam Pre-anesthesia Checklist: Patient identified, Emergency Drugs available, Suction available and Patient being monitored Patient Re-evaluated:Patient Re-evaluated prior to inductionOxygen Delivery Method: Circle System Utilized Preoxygenation: Pre-oxygenation with 100% oxygen Intubation Type: IV induction Ventilation: Mask ventilation without difficulty LMA: LMA inserted LMA Size: 5.0 Number of attempts: 1 Airway Equipment and Method: Bite block Placement Confirmation: positive ETCO2 and breath sounds checked- equal and bilateral Tube secured with: Tape Dental Injury: Teeth and Oropharynx as per pre-operative assessment

## 2015-08-14 NOTE — Op Note (Signed)
#  [id number] 

## 2015-08-14 NOTE — Discharge Instructions (Signed)

## 2015-08-15 ENCOUNTER — Encounter (HOSPITAL_BASED_OUTPATIENT_CLINIC_OR_DEPARTMENT_OTHER): Payer: Self-pay | Admitting: Orthopaedic Surgery

## 2015-08-15 NOTE — Op Note (Signed)
NAMEYADRIEL, KERRIGAN            ACCOUNT NO.:  [id number]  MEDICAL RECORD NO.:  [id number]  LOCATION:                               FACILITY:  [facility]  PHYSICIAN:  Lubertha Basque. Dalldorf, M.D.DATE OF BIRTH:  03/20/1961  DATE OF PROCEDURE:  08/14/2015 DATE OF DISCHARGE:  08/14/2015                              OPERATIVE REPORT   PREOPERATIVE DIAGNOSES: 1. Right knee torn medial and torn lateral meniscus. 2. Right knee degenerative joint disease. 3. Left knee torn meniscus.  POSTOPERATIVE DIAGNOSES: 1. Right knee torn medial and torn lateral meniscus. 2. Right knee degenerative joint disease. 3. Left knee torn meniscus.  PROCEDURES: 1. Right knee partial medial and partial lateral meniscectomy. 2. Right knee abrasion chondroplasty of patellofemoral and medial. 3. Left knee injection.  ANESTHESIA:  General.  ATTENDING SURGEON:  Lubertha Basque. Jerl Santos, M.D.  ASSISTANT:  Elodia Florence, P.A.  INDICATION FOR PROCEDURE:  The patient is a 54 year old man with a history of bilateral knee pain.  He was treated with an injection on the left and it is somewhat better, but persists with right-sided pain.  He has MRI scan evidence of meniscus injuries on both sides, but the right is his most symptomatic.  This keeps him from resting and being able to participate and exercise and walking.  He was offered an arthroscopy on the right.  Informed operative consent was obtained after discussion of possible complications including reaction to anesthesia and infection.  SUMMARY OF FINDINGS AND PROCEDURE:  Under general anesthesia, we first performed an injection of the left knee at his request.  We then performed the arthroscopy on the right knee.  Suprapatellar pouch was benign while the patellofemoral joint exhibited some broad areas of grade 4 change to the intertrochlear groove.  A thorough chondroplasty was done along with abrasion and bleeding bone in some small areas. Medial compartment exhibited  a displaced tear of the medial meniscus which was flipped anteriorly.  I performed about 25% partial medial meniscectomy.  He also had some grade 3 and very focal grade 4 change in this compartment.  ACL was normal.  Lateral compartment exhibited a small radial tear of the middle horn and about 5% partial lateral meniscectomy was done.  He had no degenerative changes in this compartment.  He was injected with the usual agents plus Depo-Medrol and discharged home.  DESCRIPTION OF PROCEDURE:  The patient was taken to the operating suite where general anesthetic was applied without difficulty.  He was positioned supine.  After sterile prep, we injected the left knee and placed a Band-Aid.  He was then prepped and draped in normal sterile fashion.  After administration of IV Kefzol and an appropriate time-out, an arthroscopy of the right knee was performed through total of 2 portals.  Findings were as noted above and procedure consisted mostly of the partial medial meniscectomy which was done with basket and shaver. I then performed a chondroplasty which was described above.  The knee was thoroughly irrigated followed by the placement of Marcaine with epinephrine and morphine plus some Depo-Medrol.  Adaptic was placed over the portals followed by dry gauze and loose Ace wrap.  Estimated blood loss and fluids obtained from Anesthesia  records.  DISPOSITION:  The patient was extubated in the operating room and taken to the recovery room in stable addition.  He was to go home same-day and follow up in the office closely.  I will contact him by phone tonight.     Lubertha Basque Jerl Santos, M.D.     PGD/MEDQ  D:  08/14/2015  T:  08/14/2015  Job:  [id number]

## 2015-09-19 ENCOUNTER — Ambulatory Visit (INDEPENDENT_AMBULATORY_CARE_PROVIDER_SITE_OTHER): Payer: BLUE CROSS/BLUE SHIELD | Admitting: Urology

## 2015-09-19 DIAGNOSIS — R3121 Asymptomatic microscopic hematuria: Secondary | ICD-10-CM

## 2015-09-19 DIAGNOSIS — N5201 Erectile dysfunction due to arterial insufficiency: Secondary | ICD-10-CM | POA: Diagnosis not present

## 2015-09-19 DIAGNOSIS — N401 Enlarged prostate with lower urinary tract symptoms: Secondary | ICD-10-CM | POA: Diagnosis not present

## 2015-09-19 DIAGNOSIS — R3912 Poor urinary stream: Secondary | ICD-10-CM | POA: Diagnosis not present

## 2015-11-21 ENCOUNTER — Ambulatory Visit (INDEPENDENT_AMBULATORY_CARE_PROVIDER_SITE_OTHER): Payer: BLUE CROSS/BLUE SHIELD | Admitting: Urology

## 2015-11-21 DIAGNOSIS — R3912 Poor urinary stream: Secondary | ICD-10-CM | POA: Diagnosis not present

## 2015-11-21 DIAGNOSIS — R3121 Asymptomatic microscopic hematuria: Secondary | ICD-10-CM | POA: Diagnosis not present

## 2015-11-21 DIAGNOSIS — N5201 Erectile dysfunction due to arterial insufficiency: Secondary | ICD-10-CM | POA: Diagnosis not present

## 2015-11-21 DIAGNOSIS — N401 Enlarged prostate with lower urinary tract symptoms: Secondary | ICD-10-CM

## 2015-11-23 HISTORY — PX: KNEE SURGERY: SHX244

## 2016-09-02 NOTE — Progress Notes (Signed)
Cardiology Office Note   Date:  09/03/2016   ID:  Mason Patterson, DOB 03-28-1961, MRN [id number]  PCP:  Colette RibasGOLDING, JOHN CABOT, MD  Cardiologist:   Charlton HawsPeter Keller Mikels, MD   No chief complaint on file.     History of Present Illness: Mason Patterson is a 55 y.o. male who presents for evaluation of abnormal ECG.  Coronary Risk factors HTN and elevated lipids Noted ST elevation in precordial leads on ECG V 234  Reviewed ECG from 08/13/16 and benign with early reopol/ J point elevation in V234 However the J point elevation was more prominent in V34 than ECG;s from 2014 and 2016   [place] No smoking , ETOH or drugs  Engineer, manufacturing systemsees chiropractor for back pain Sedentary but starting to exercise more for back No chest pain or signs of pericarditis.  He has a soft tissue mass in left pectoral area that gets tender from time to time   Labs reviewed:  TC 236 HDL 47 LDL 176 Tri 66 07/24/15    Past Medical History:  Diagnosis Date  . Acute medial meniscal injury of right knee   . Arthritis   . Hyperlipidemia   . Hypertension     Past Surgical History:  Procedure Laterality Date  . CHONDROPLASTY Right 08/14/2015   Procedure: CHONDROPLASTY;  Surgeon: Marcene CorningPeter Dalldorf, MD;  Location: [facility];  Service: Orthopedics;  Laterality: Right;  . KNEE ARTHROSCOPY WITH MEDIAL MENISECTOMY Right 08/14/2015   Procedure: KNEE ARTHROSCOPY WITH MEDIAL AND LATERAL MENISECTOMY;  Surgeon: Marcene CorningPeter Dalldorf, MD;  Location: [facility];  Service: Orthopedics;  Laterality: Right;     Current Outpatient Prescriptions  Medication Sig Dispense Refill  . losartan (COZAAR) 50 MG tablet Take 50 mg by mouth daily.     No current facility-administered medications for this visit.     Allergies:   Darvocet [propoxyphene n-acetaminophen] and Strawberry extract    Social History:  The patient  reports that he has never smoked. He has never  used smokeless tobacco. He reports that he does not drink alcohol or use drugs.   Family History:  The patient's family history includes Cancer in his brother and brother; Diabetes in his mother.    ROS:  Please see the history of present illness.   Otherwise, review of systems are positive for none.   All other systems are reviewed and negative.    PHYSICAL EXAM: VS:  BP 130/80   Pulse 95   Ht 5\' 8"  (1.727 m)   Wt 94.3 kg (208 lb)   SpO2 98%   BMI 31.63 kg/m  , BMI Body mass index is 31.63 kg/m. Affect appropriate Healthy:  appears stated age HEENT: normal Neck supple with no adenopathy JVP normal no bruits no thyromegaly Lungs clear with no wheezing and good diaphragmatic motion Heart:  S1/S2 no murmur, no rub, gallop or click PMI normal Abdomen: benighn, BS positve, no tenderness, no AAA no bruit.  No HSM or HJR Distal pulses intact with no bruits No edema Neuro non-focal Skin warm and dry No muscular weakness    EKG:  SEE HPI  J point elevation V234 07/2016 compared to 2016 and 2014 minor change    Recent Labs: No results found for requested labs within last 8760 hours.    Lipid Panel No results found for: CHOL, TRIG, HDL, CHOLHDL, VLDL, LDLCALC, LDLDIRECT    Wt Readings from Last 3 Encounters:  09/03/16 94.3 kg (208 lb)  08/14/15 90.3 kg (199 lb)  07/12/15 95.3 kg (210 lb)      Other studies Reviewed: Additional studies/ records that were reviewed today include: Notes and ECG from primary Mamogram in Epic .    ASSESSMENT AND PLAN:  1.  Abnormal ECG:  Benign normal variant for him f/u echo for LV size and function no clinical history to  Suspect pericarditis or cAD 2. Breast: I cannot palpate mass and mamogram with benign fatty tissue 3. Lumbago:  F/u chiropractor discussed core exercises  4. HTN:  Well controlled.  Continue current medications and low sodium Dash type diet.      Current medicines are reviewed at length with the patient today.   The patient does not have concerns regarding medicines.  The following changes have been made:  no change  Labs/ tests ordered today include: Echo   Orders Placed This Encounter  Procedures  . ECHOCARDIOGRAM COMPLETE     Disposition:   FU with [place] PRN      Signed, Charlton Haws, MD  09/03/2016 2:53 PM    [facility] [address] [place], [place], [place]  [address] Phone: [phone]; Fax: [phone]

## 2016-09-03 ENCOUNTER — Encounter: Payer: Self-pay | Admitting: Cardiovascular Disease

## 2016-09-03 ENCOUNTER — Ambulatory Visit (INDEPENDENT_AMBULATORY_CARE_PROVIDER_SITE_OTHER): Payer: BLUE CROSS/BLUE SHIELD | Admitting: Cardiovascular Disease

## 2016-09-03 VITALS — BP 130/80 | HR 95 | Ht 68.0 in | Wt 208.0 lb

## 2016-09-03 DIAGNOSIS — R9431 Abnormal electrocardiogram [ECG] [EKG]: Secondary | ICD-10-CM

## 2016-09-03 NOTE — Patient Instructions (Signed)
Your physician recommends that you continue on your current medications as directed. Please refer to the Current Medication list given to you today.   Your physician has requested that you have an echocardiogram. Echocardiography is a painless test that uses sound waves to create images of your heart. It provides your doctor with information about the size and shape of your heart and how well your heart's chambers and valves are working. This procedure takes approximately one hour. There are no restrictions for this procedure.  Your physician recommends that you schedule a follow-up appointment in: AS NEEDED  Thanks for choosing [facility]!!!

## 2016-09-20 ENCOUNTER — Ambulatory Visit (HOSPITAL_COMMUNITY): Payer: BLUE CROSS/BLUE SHIELD

## 2017-01-14 ENCOUNTER — Ambulatory Visit (INDEPENDENT_AMBULATORY_CARE_PROVIDER_SITE_OTHER): Payer: BLUE CROSS/BLUE SHIELD | Admitting: Urology

## 2017-01-14 DIAGNOSIS — R3121 Asymptomatic microscopic hematuria: Secondary | ICD-10-CM

## 2017-01-14 DIAGNOSIS — N5201 Erectile dysfunction due to arterial insufficiency: Secondary | ICD-10-CM

## 2017-01-14 DIAGNOSIS — N401 Enlarged prostate with lower urinary tract symptoms: Secondary | ICD-10-CM

## 2017-01-14 DIAGNOSIS — B379 Candidiasis, unspecified: Secondary | ICD-10-CM | POA: Diagnosis not present

## 2017-01-14 DIAGNOSIS — R3912 Poor urinary stream: Secondary | ICD-10-CM

## 2017-08-15 ENCOUNTER — Encounter (HOSPITAL_COMMUNITY): Payer: Self-pay

## 2017-08-15 ENCOUNTER — Emergency Department (HOSPITAL_COMMUNITY)
Admission: EM | Admit: 2017-08-15 | Discharge: 2017-08-15 | Disposition: A | Discharge status: Home / Self Care | Payer: BLUE CROSS/BLUE SHIELD | Attending: Emergency Medicine | Admitting: Emergency Medicine

## 2017-08-15 DIAGNOSIS — Z79899 Other long term (current) drug therapy: Secondary | ICD-10-CM | POA: Diagnosis not present

## 2017-08-15 DIAGNOSIS — S3992XA Unspecified injury of lower back, initial encounter: Secondary | ICD-10-CM | POA: Diagnosis present

## 2017-08-15 DIAGNOSIS — Y9389 Activity, other specified: Secondary | ICD-10-CM | POA: Insufficient documentation

## 2017-08-15 DIAGNOSIS — Y92410 Unspecified street and highway as the place of occurrence of the external cause: Secondary | ICD-10-CM | POA: Diagnosis not present

## 2017-08-15 DIAGNOSIS — Y999 Unspecified external cause status: Secondary | ICD-10-CM | POA: Diagnosis not present

## 2017-08-15 DIAGNOSIS — S161XXA Strain of muscle, fascia and tendon at neck level, initial encounter: Secondary | ICD-10-CM | POA: Diagnosis not present

## 2017-08-15 DIAGNOSIS — I10 Essential (primary) hypertension: Secondary | ICD-10-CM | POA: Diagnosis not present

## 2017-08-15 DIAGNOSIS — S39012A Strain of muscle, fascia and tendon of lower back, initial encounter: Secondary | ICD-10-CM | POA: Diagnosis not present

## 2017-08-15 MED ORDER — IBUPROFEN 600 MG PO TABS: 600.0000 mg | ORAL_TABLET | Freq: Four times a day (QID) | ORAL | 0 refills | Status: DC

## 2017-08-15 MED ORDER — CYCLOBENZAPRINE HCL 10 MG PO TABS: 10.0000 mg | ORAL_TABLET | Freq: Three times a day (TID) | ORAL | 0 refills | Status: DC
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
## 2017-08-15 NOTE — Discharge Instructions (Signed)
Your vital signs are within normal limits. Your exam suggest cervical and lumbar strain. Please rest as much as possible. Please use ibuprofen with each meal and at bed time. Use flexeril for spasm pain. This medication may cause drowsiness. Please do not drink, drive, or participate in activity that requires concentration while taking this medication. Please see your primary MD or return to the ED if any changes or problems.

## 2018-02-10 ENCOUNTER — Ambulatory Visit (INDEPENDENT_AMBULATORY_CARE_PROVIDER_SITE_OTHER): Payer: BLUE CROSS/BLUE SHIELD | Admitting: Urology

## 2018-02-10 DIAGNOSIS — R3912 Poor urinary stream: Secondary | ICD-10-CM

## 2018-02-10 DIAGNOSIS — N401 Enlarged prostate with lower urinary tract symptoms: Secondary | ICD-10-CM | POA: Diagnosis not present

## 2018-02-10 DIAGNOSIS — N5201 Erectile dysfunction due to arterial insufficiency: Secondary | ICD-10-CM

## 2018-03-07 ENCOUNTER — Encounter: Payer: Self-pay | Admitting: Family Medicine

## 2018-03-23 ENCOUNTER — Encounter (HOSPITAL_COMMUNITY): Payer: Self-pay

## 2018-03-23 ENCOUNTER — Other Ambulatory Visit: Payer: Self-pay

## 2018-03-23 ENCOUNTER — Emergency Department (HOSPITAL_COMMUNITY): Payer: BLUE CROSS/BLUE SHIELD

## 2018-03-23 ENCOUNTER — Emergency Department (HOSPITAL_COMMUNITY)
Admission: EM | Admit: 2018-03-23 | Discharge: 2018-03-23 | Disposition: A | Discharge status: Home / Self Care | Payer: BLUE CROSS/BLUE SHIELD | Attending: Emergency Medicine | Admitting: Emergency Medicine

## 2018-03-23 DIAGNOSIS — I10 Essential (primary) hypertension: Secondary | ICD-10-CM | POA: Insufficient documentation

## 2018-03-23 DIAGNOSIS — R1032 Left lower quadrant pain: Secondary | ICD-10-CM | POA: Diagnosis not present

## 2018-03-23 DIAGNOSIS — K5792 Diverticulitis of intestine, part unspecified, without perforation or abscess without bleeding: Secondary | ICD-10-CM

## 2018-03-23 DIAGNOSIS — Z79899 Other long term (current) drug therapy: Secondary | ICD-10-CM | POA: Diagnosis not present

## 2018-03-23 LAB — URINALYSIS, ROUTINE W REFLEX MICROSCOPIC
Bacteria, UA: NONE SEEN
Bilirubin Urine: NEGATIVE
Glucose, UA: NEGATIVE mg/dL
Ketones, ur: NEGATIVE mg/dL
Leukocytes, UA: NEGATIVE
Nitrite: NEGATIVE
Protein, ur: NEGATIVE mg/dL
Specific Gravity, Urine: 1.025 (ref 1.005–1.030)
pH: 5 (ref 5.0–8.0)

## 2018-03-23 LAB — CBC
HCT: 39.5 % (ref 39.0–52.0)
Hemoglobin: 13.4 g/dL (ref 13.0–17.0)
MCH: 31.9 pg (ref 26.0–34.0)
MCHC: 33.9 g/dL (ref 30.0–36.0)
MCV: 94 fL (ref 78.0–100.0)
Platelets: 343 10*3/uL (ref 150–400)
RBC: 4.2 MIL/uL — ABNORMAL LOW (ref 4.22–5.81)
RDW: 13.2 % (ref 11.5–15.5)
WBC: 9.7 10*3/uL (ref 4.0–10.5)

## 2018-03-23 LAB — COMPREHENSIVE METABOLIC PANEL
ALT: 23 U/L (ref 17–63)
AST: 20 U/L (ref 15–41)
Albumin: 3.9 g/dL (ref 3.5–5.0)
Alkaline Phosphatase: 81 U/L (ref 38–126)
Anion gap: 11 (ref 5–15)
BUN: 8 mg/dL (ref 6–20)
CO2: 24 mmol/L (ref 22–32)
Calcium: 8.9 mg/dL (ref 8.9–10.3)
Chloride: 102 mmol/L (ref 101–111)
Creatinine, Ser: 0.79 mg/dL (ref 0.61–1.24)
GFR calc Af Amer: >60 mL/min (ref >60)
GFR calc non Af Amer: >60 mL/min (ref >60)
Glucose, Bld: 110 mg/dL — ABNORMAL HIGH (ref 65–99)
Potassium: 3.4 mmol/L — ABNORMAL LOW (ref 3.5–5.1)
Sodium: 137 mmol/L (ref 135–145)
Total Bilirubin: 2 mg/dL — ABNORMAL HIGH (ref 0.3–1.2)
Total Protein: 6.9 g/dL (ref 6.5–8.1)

## 2018-03-23 LAB — LIPASE, BLOOD: Lipase: 21 U/L (ref 11–51)

## 2018-03-23 MED ORDER — IOPAMIDOL (ISOVUE-300) INJECTION 61%: 100.0000 mL | Freq: Once | INTRAVENOUS | Status: DC | PRN

## 2018-03-23 MED ORDER — HYDROCODONE-ACETAMINOPHEN 5-325 MG PO TABS: 1.0000 | ORAL_TABLET | Freq: Four times a day (QID) | ORAL | 0 refills | Status: DC | PRN

## 2018-03-23 MED ORDER — IOHEXOL 300 MG/ML  SOLN
100.0000 mL | Freq: Once | INTRAMUSCULAR | Status: AC | PRN
  Administered 2018-03-23: 100 mL via INTRAVENOUS

## 2018-03-23 MED ORDER — SODIUM CHLORIDE 0.9 % IV BOLUS
1000.0000 mL | Freq: Once | INTRAVENOUS | Status: AC
  Administered 2018-03-23: 1000 mL via INTRAVENOUS

## 2018-03-23 MED ORDER — CIPROFLOXACIN HCL 500 MG PO TABS: 500.0000 mg | ORAL_TABLET | Freq: Two times a day (BID) | ORAL | 0 refills | Status: DC

## 2018-03-23 MED ORDER — METRONIDAZOLE 500 MG PO TABS: 500.0000 mg | ORAL_TABLET | Freq: Two times a day (BID) | ORAL | 0 refills | Status: DC

## 2018-03-23 NOTE — ED Notes (Signed)
Patient transported to CT 

## 2018-03-23 NOTE — Discharge Instructions (Addendum)
Return here for any worsening in your condition. Follow up with the GI Doctor provided.

## 2018-03-23 NOTE — ED Triage Notes (Signed)
Patient complains of LLQ abdominal pain intermittently x 2 days. Had diarrhea last week that has resolved than constipation x 2 days that has been resolved with stool softeners. No nausea, no vomiting. Denies dysuria

## 2018-03-23 NOTE — ED Provider Notes (Signed)
[facility] EMERGENCY DEPARTMENT Provider Note   CSN: [id number] Arrival date & time: 03/23/18  0725     History   Chief Complaint No chief complaint on file.   HPI Mason Patterson is a 57 y.o. male.  HPI Patient presents to the emergency department with left lower quadrant abdominal pain that started 2 days ago.  The patient states that he did have an episode of diarrhea that lasted for 3 days prior to this abdominal pain.  Patient states he never noted any blood in the stools.  Patient states he did take some Imodium for the diarrhea with some relief of the symptoms.  Patient states he has been taking the Colace because he felt that he was may be getting constipated.  The patient denies chest pain, shortness of breath, headache,blurred vision, neck pain, fever, cough, weakness, numbness, dizziness, anorexia, edema,nausea, vomiting,  rash, back pain, dysuria, hematemesis, bloody stool, near syncope, or syncope. Past Medical History:  Diagnosis Date  . Acute medial meniscal injury of right knee   . Arthritis   . Hyperlipidemia   . Hypertension     There are no active problems to display for this patient.   Past Surgical History:  Procedure Laterality Date  . CHONDROPLASTY Right 08/14/2015   Procedure: CHONDROPLASTY;  Surgeon: Marcene Corning, MD;  Location: [facility];  Service: Orthopedics;  Laterality: Right;  . KNEE ARTHROSCOPY WITH MEDIAL MENISECTOMY Right 08/14/2015   Procedure: KNEE ARTHROSCOPY WITH MEDIAL AND LATERAL MENISECTOMY;  Surgeon: Marcene Corning, MD;  Location: [facility];  Service: Orthopedics;  Laterality: Right;        Home Medications    Prior to Admission medications   Medication Sig Start Date End Date Taking? Authorizing Provider  Multiple Vitamin (THERA) TABS Take 1 tablet by mouth daily.   Yes [provider]  valsartan (DIOVAN) 160 MG tablet Take 160 mg by mouth daily. 02/22/18  Yes [provider]  cyclobenzaprine (FLEXERIL) 10 MG tablet Take 1 tablet (10 mg total) by mouth 3 (three) times daily. Patient not taking: Reported on 03/23/2018 08/15/17   Ivery Quale, PA-C  ibuprofen (ADVIL,MOTRIN) 600 MG tablet Take 1 tablet (600 mg total) by mouth 4 (four) times daily. Patient not taking: Reported on 03/23/2018 08/15/17   Ivery Quale, PA-C    Family History Family History  Problem Relation Age of Onset  . Diabetes Mother   . Cancer Brother   . Cancer Brother     Social History Social History   Tobacco Use  . Smoking status: Never Smoker  . Smokeless tobacco: Never Used  Substance Use Topics  . Alcohol use: No  . Drug use: No     Allergies   Darvocet [propoxyphene n-acetaminophen] and Strawberry extract   Review of Systems Review of Systems All other systems negative except as documented in the HPI. All pertinent positives and negatives as reviewed in the HPI.   Physical Exam Updated Vital Signs BP (!) 148/89   Pulse 82   Temp 98.4 F (36.9 C) (Oral)   Resp 15   Ht 5' 9.5" (1.765 m)   Wt 88 kg (194 lb)   SpO2 100%   BMI 28.24 kg/m   Physical Exam  Constitutional: He is oriented to person, place, and time. He appears well-developed and well-nourished. No distress.  HENT:  Head: Normocephalic and atraumatic.  Mouth/Throat: Oropharynx is clear and moist.  Eyes: Pupils are equal, round, and reactive to light.  Neck: Normal range of motion. Neck supple.  Cardiovascular: Normal rate, regular rhythm and normal heart sounds. Exam reveals no gallop and no friction rub.  No murmur heard. Pulmonary/Chest: Effort normal and breath sounds normal. No respiratory distress. He has no wheezes.  Abdominal: Soft. Bowel sounds are normal. He exhibits no distension and no mass. There is tenderness. There is no guarding.    Neurological: He is alert and oriented to person, place, and time. He exhibits normal muscle tone. Coordination normal.  Skin: Skin is  warm and dry. Capillary refill takes less than 2 seconds. No rash noted. No erythema.  Psychiatric: He has a normal mood and affect. His behavior is normal.  Nursing note and vitals reviewed.    ED Treatments / Results  Labs (all labs ordered are listed, but only abnormal results are displayed) Labs Reviewed  COMPREHENSIVE METABOLIC PANEL - Abnormal; Notable for the following components:      Result Value   Potassium 3.4 (*)    Glucose, Bld 110 (*)    Total Bilirubin 2.0 (*)    All other components within normal limits  CBC - Abnormal; Notable for the following components:   RBC 4.20 (*)    All other components within normal limits  URINALYSIS, ROUTINE W REFLEX MICROSCOPIC - Abnormal; Notable for the following components:   Hgb urine dipstick SMALL (*)    All other components within normal limits  LIPASE, BLOOD    EKG None  Radiology Ct Abdomen Pelvis W Contrast  Result Date: 03/23/2018 CLINICAL DATA:  Abdominal infection. Left lower quadrant pain for 3 days EXAM: CT ABDOMEN AND PELVIS WITH CONTRAST TECHNIQUE: Multidetector CT imaging of the abdomen and pelvis was performed using the standard protocol following bolus administration of intravenous contrast. CONTRAST:  OMNIPAQUE IOHEXOL 300 MG/ML  SOLN COMPARISON:  01/07/2010 FINDINGS: Lower chest: No acute abnormality. Hepatobiliary: No focal liver abnormality is seen. No gallstones, gallbladder wall thickening, or biliary dilatation. Pancreas: Unremarkable. No pancreatic ductal dilatation or surrounding inflammatory changes. Spleen: Normal in size without focal abnormality. Adrenals/Urinary Tract: Normal adrenal glands. Right upper pole cyst measures 2 cm. Inferior pole right kidney cyst measures 3.1 cm. No mass or hydronephrosis identified. Urinary bladder appears within normal limits. Stomach/Bowel: Stomach is within normal limits. Appendix appears normal. Normal appearance of the small bowel. Distal colonic diverticulosis  noted. There is wall thickening and inflammation involving the distal descending colon/proximal sigmoid colon compatible with acute diverticulitis. A small amount of free fluid is identified within the left pericolic gutter. No fluid collections identified. No free air. Vascular/Lymphatic: Mild aortic atherosclerosis. No aneurysm. No upper abdominal adenopathy identified. No pelvic or inguinal adenopathy. Reproductive: Prostate is unremarkable. Other: Small periumbilical hernia contains fat only. Musculoskeletal: No aggressive lytic or sclerotic bone lesions. IMPRESSION: 1. Exam positive for acute diverticulitis distal descending colon/proximal sigmoid colon. No abscess or free intraperitoneal air. 2. Right kidney cysts. 3.  Aortic Atherosclerosis (ICD10-I70.0). Electronically Signed   By: Signa Kell M.D.   On: 03/23/2018 14:36    Procedures Procedures (including critical care time)  Medications Ordered in ED Medications  iopamidol (ISOVUE-300) 61 % injection 100 mL (100 mLs Intravenous Canceled Entry 03/23/18 1341)  sodium chloride 0.9 % bolus 1,000 mL (0 mLs Intravenous Stopped 03/23/18 1341)  iohexol (OMNIPAQUE) 300 MG/ML solution 100 mL (100 mLs Intravenous Contrast Given 03/23/18 1409)     Initial Impression / Assessment and Plan / ED Course  I have reviewed the triage vital signs and the nursing  notes.  Pertinent labs & imaging results that were available during my care of the patient were reviewed by me and considered in my medical decision making (see chart for details).    Patient is given full results of his CT scan which confirmed a diverticulitis diagnosis.  I advised the patient that he will need to follow-up with GI for further evaluation and care.  I did give him return precautions.  The patient is advised to take all the antibiotics.  Patient was also advised to increase his fluid intake.  Patient agrees to the plan and all questions were answered.  Final Clinical Impressions(s) /  ED Diagnoses   Final diagnoses:  None    ED Discharge Orders    None       Charlestine Night, PA-C 03/23/18 1507    Lorre Nick, MD 03/23/18 [phone]

## 2019-05-07 IMAGING — CT CT ABD-PELV W/ CM
2 of 5 series · 16 of 46 positions shown, 18 images · IV contrast (omnipaque)
Comparison: 01/07/2010

CLINICAL DATA: Abdominal infection. Left lower quadrant pain for 3
days

EXAM:
CT ABDOMEN AND PELVIS WITH CONTRAST
TECHNIQUE: Multidetector CT imaging of the abdomen and pelvis was performed
using the standard protocol following bolus administration of
intravenous contrast.
CONTRAST:  100mL OMNIPAQUE IOHEXOL 300 MG/ML  SOLN

[Series 3: a/p w/ 5mm · axial · 0.89mm/px · z∈[+938,+1348]mm · 13 of 92 slices shown, 15 images]
[im 5/92  soft-tissue]
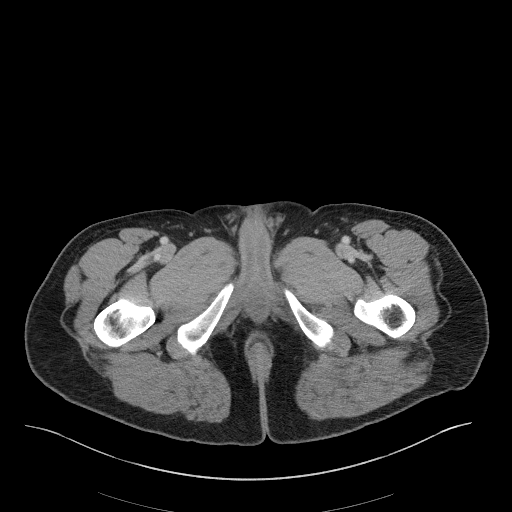
[im 5/92  bone]
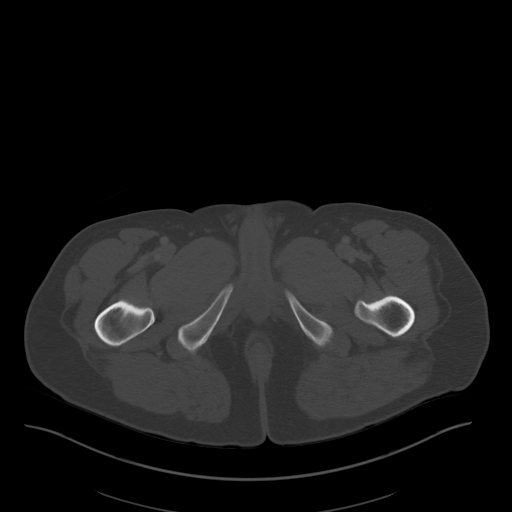
[im 15/92  soft-tissue]
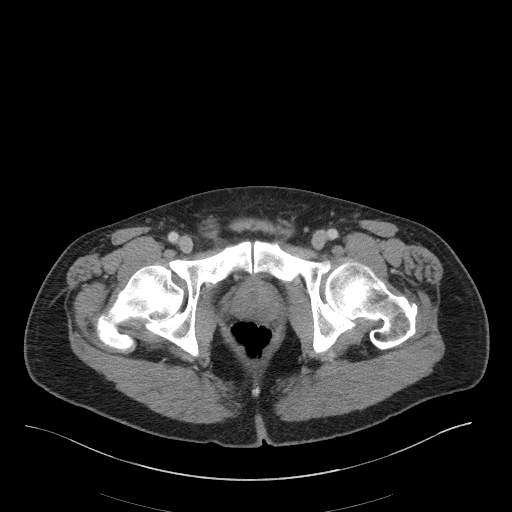
[im 20/92  soft-tissue]
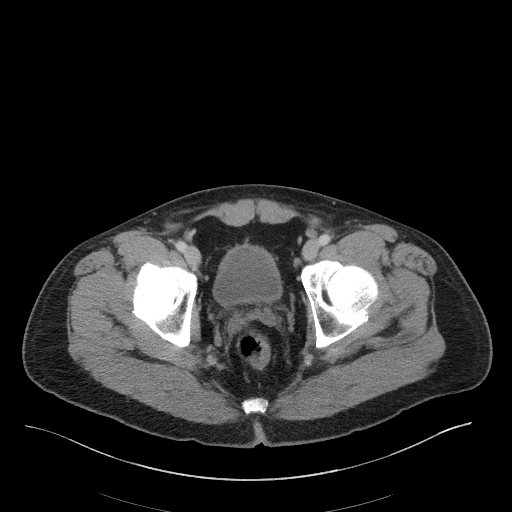
[im 24/92  soft-tissue]
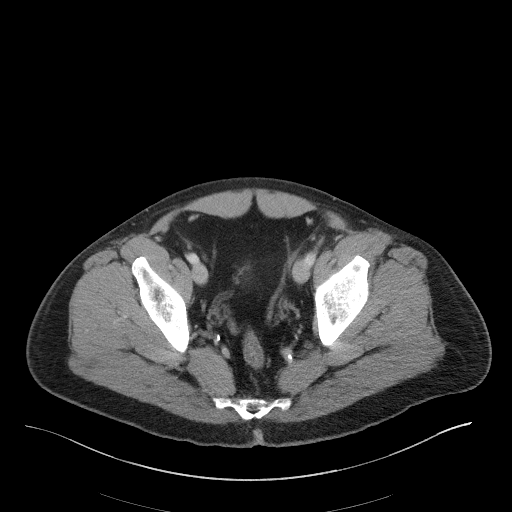
[im 34/92  soft-tissue]
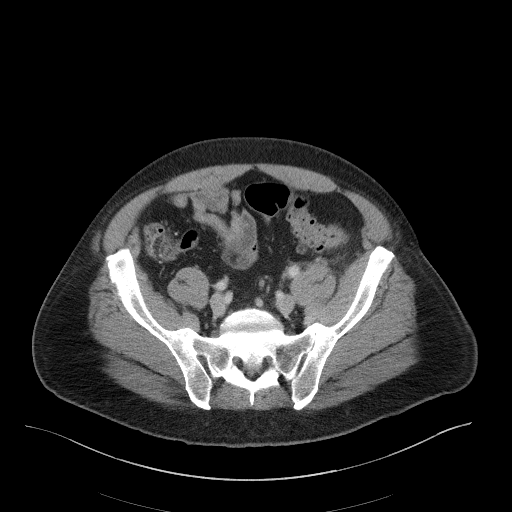
[im 39/92  soft-tissue]
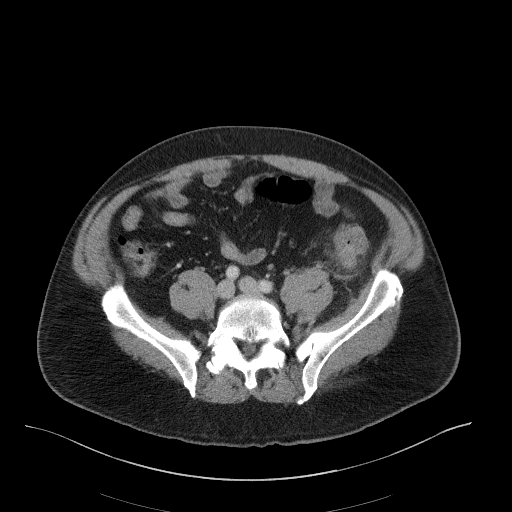
[im 48/92  soft-tissue]
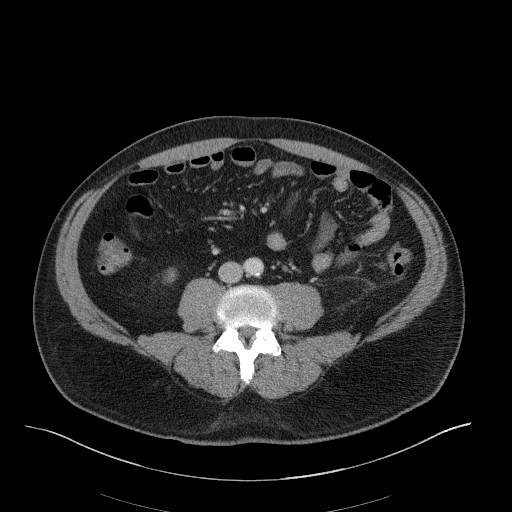
[im 53/92  soft-tissue]
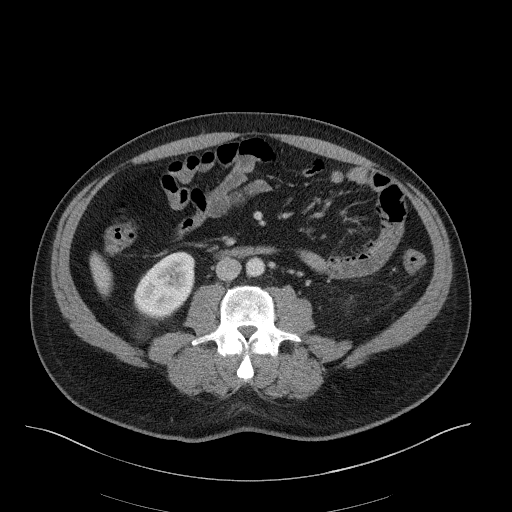
[im 58/92  soft-tissue]
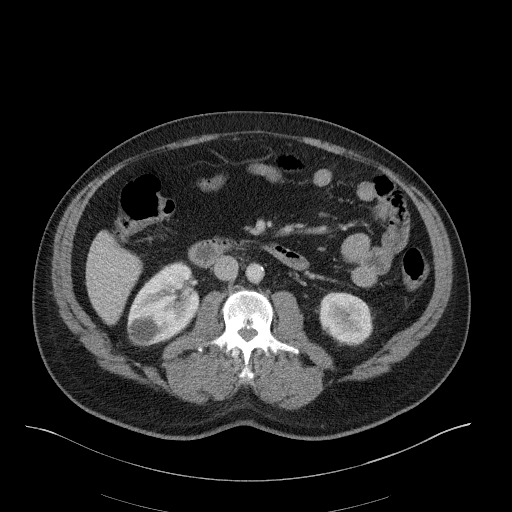
[im 58/92  bone]
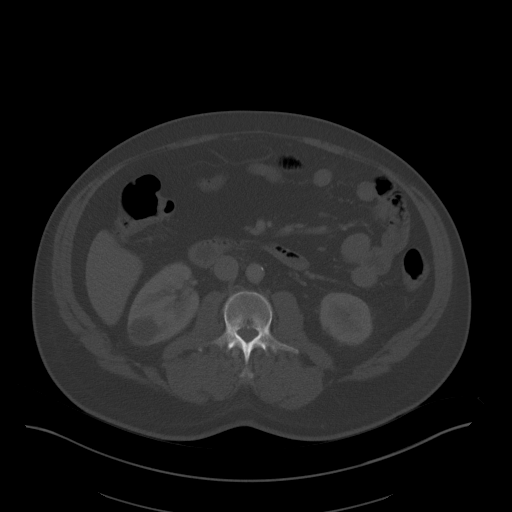
[im 68/92  soft-tissue]
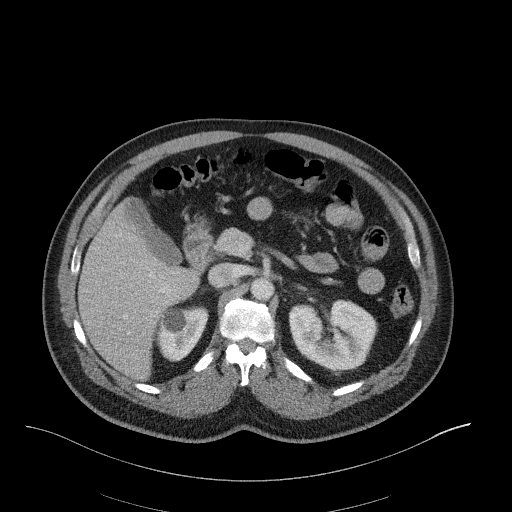
[im 72/92  soft-tissue]
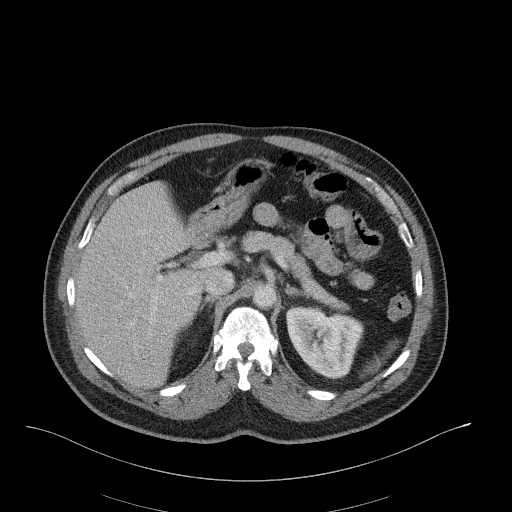
[im 77/92  soft-tissue]
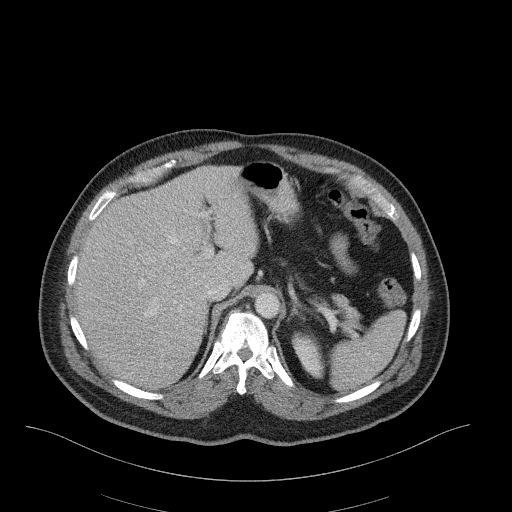
[im 87/92  soft-tissue]
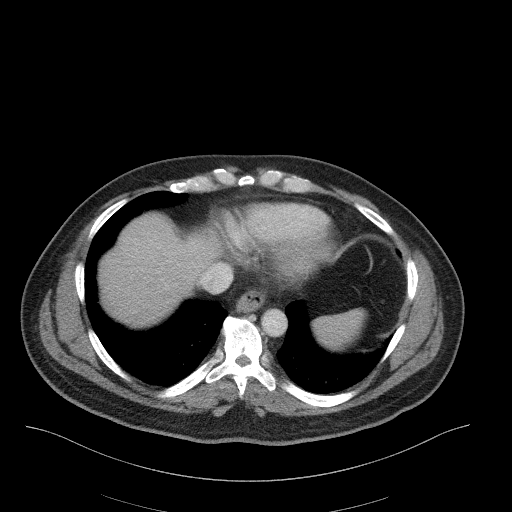

[Series 6: a/p w/ cor · coronal · 0.93mm/px · 3 of 141 slices shown]
[im 47/141  soft-tissue]
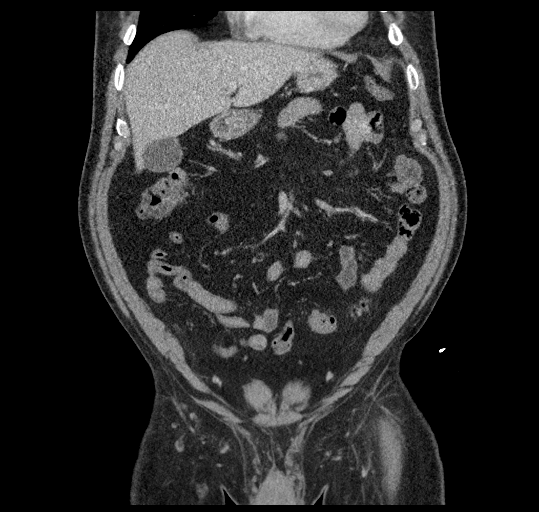
[im 63/141  soft-tissue]
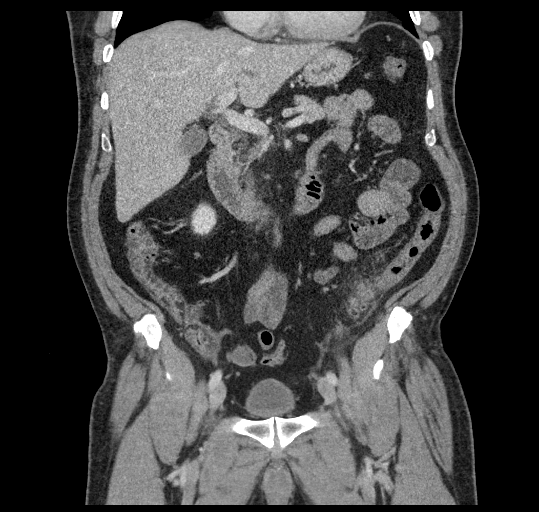
[im 78/141  soft-tissue]
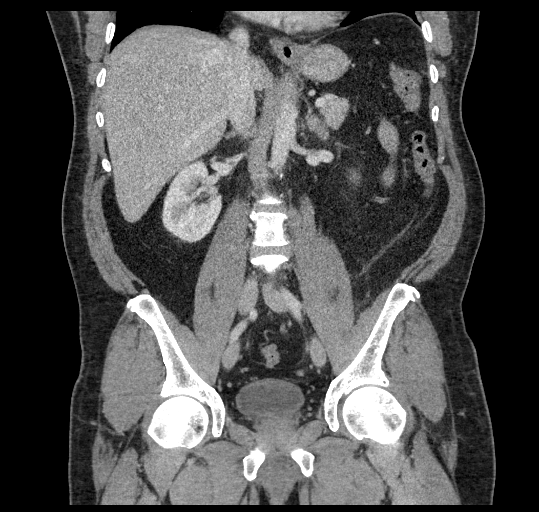

[16 of 46 positions shown; findings below may reference images not displayed]

FINDINGS: Lower chest: No acute abnormality.

Hepatobiliary: No focal liver abnormality is seen. No gallstones,
gallbladder wall thickening, or biliary dilatation.

Pancreas: Unremarkable. No pancreatic ductal dilatation or
surrounding inflammatory changes.

Spleen: Normal in size without focal abnormality.

Adrenals/Urinary Tract: Normal adrenal glands. Right upper pole cyst
measures 2 cm. Inferior pole right kidney cyst measures 3.1 cm. No
mass or hydronephrosis identified. Urinary bladder appears within
normal limits.

Stomach/Bowel: Stomach is within normal limits. Appendix appears
normal. Normal appearance of the small bowel. Distal colonic
diverticulosis noted. There is wall thickening and inflammation
involving the distal descending colon/proximal sigmoid colon
compatible with acute diverticulitis. A small amount of free fluid
is identified within the left pericolic gutter. No fluid collections
identified. No free air.

Vascular/Lymphatic: Mild aortic atherosclerosis. No aneurysm. No
upper abdominal adenopathy identified. No pelvic or inguinal
adenopathy.

Reproductive: Prostate is unremarkable.

Other: Small periumbilical hernia contains fat only.

Musculoskeletal: No aggressive lytic or sclerotic bone lesions.
IMPRESSION: 1. Exam positive for acute diverticulitis distal descending
colon/proximal sigmoid colon. No abscess or free intraperitoneal
air.
2. Right kidney cysts.
3.  Aortic Atherosclerosis ([id number]).

## 2019-06-24 ENCOUNTER — Other Ambulatory Visit: Payer: Self-pay

## 2019-06-24 ENCOUNTER — Ambulatory Visit (HOSPITAL_COMMUNITY)
Admission: EM | Admit: 2019-06-24 | Discharge: 2019-06-24 | Disposition: A | Discharge status: Home / Self Care | Payer: BC Managed Care – PPO | Attending: Family Medicine | Admitting: Family Medicine

## 2019-06-24 ENCOUNTER — Encounter (HOSPITAL_COMMUNITY): Payer: Self-pay | Admitting: Family Medicine

## 2019-06-24 DIAGNOSIS — K5732 Diverticulitis of large intestine without perforation or abscess without bleeding: Secondary | ICD-10-CM | POA: Diagnosis not present

## 2019-06-24 MED ORDER — TRAMADOL HCL 50 MG PO TABS: 50.0000 mg | ORAL_TABLET | Freq: Four times a day (QID) | ORAL | 0 refills | Status: DC | PRN

## 2019-06-24 MED ORDER — AMOXICILLIN-POT CLAVULANATE 875-125 MG PO TABS: 1.0000 | ORAL_TABLET | Freq: Two times a day (BID) | ORAL | 0 refills | Status: DC

## 2019-06-24 NOTE — ED Provider Notes (Signed)
[facility]    CSN: [id number] Arrival date & time: 06/24/19  1023     History   Chief Complaint Chief Complaint  Patient presents with  . Abdominal Pain    HPI Mason Patterson is a 58 y.o. male.   [facility] this gentleman with abdominal pain.  He was diagnosed with diverticulitis a year ago.  The pain started two days ago and patient had some vicodin that he took for the pain.  He is still hungry and has not run a fever.  He took some stool softeners as well.  He denies blood per rectum, fever, nausea or vomiting.  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     Past Medical History:  Diagnosis Date  . Acute medial meniscal injury of right knee   . Arthritis   . Hyperlipidemia   . Hypertension     There are no active problems to display for this patient.   Past Surgical History:  Procedure Laterality Date  . CHONDROPLASTY Right 08/14/2015   Procedure: CHONDROPLASTY;  Surgeon: Marcene CorningPeter Dalldorf, MD;  Location: [facility];  Service: Orthopedics;  Laterality: Right;  . KNEE ARTHROSCOPY WITH MEDIAL MENISECTOMY Right 08/14/2015   Procedure: KNEE ARTHROSCOPY WITH MEDIAL AND LATERAL MENISECTOMY;  Surgeon: Marcene CorningPeter Dalldorf, MD;  Location: [facility];  Service: Orthopedics;  Laterality: Right;       Home Medications    Prior to Admission medications   Medication Sig Start Date End Date Taking? Authorizing Provider  amoxicillin-clavulanate (AUGMENTIN) 875-125 MG tablet Take 1 tablet by mouth every 12 (twelve) hours. 06/24/19   Elvina SidleLauenstein, Adamary Savary, MD  Multiple Vitamin (THERA) TABS Take 1 tablet by mouth daily.    [provider]  traMADol (ULTRAM) 50 MG tablet Take 1 tablet (50 mg total) by mouth every 6 (six) hours as needed. 06/24/19   Elvina SidleLauenstein, Luismiguel Lamere, MD  valsartan (DIOVAN) 160 MG tablet Take 160 mg by mouth daily. 02/22/18   [provider]    Family History Family History  Problem Relation Age  of Onset  . Diabetes Mother   . Cancer Brother   . Cancer Brother     Social History Social History   Tobacco Use  . Smoking status: Never Smoker  . Smokeless tobacco: Never Used  Substance Use Topics  . Alcohol use: No  . Drug use: No     Allergies   Darvocet [propoxyphene n-acetaminophen] and Strawberry extract   Review of Systems Review of Systems  Gastrointestinal: Positive for abdominal pain. Negative for nausea and vomiting.  All other systems reviewed and are negative.    Physical Exam Triage Vital Signs ED Triage Vitals  Enc Vitals Group     BP      Pulse      Resp      Temp      Temp src      SpO2      Weight      Height      Head Circumference      Peak Flow      Pain Score      Pain Loc      Pain Edu?      Excl. in GC?    No data found.  Updated Vital Signs BP (!) 151/93 (BP Location: Right Arm)   Pulse (!) 102   Temp 98.7 F (37.1 C) (Oral)   Resp 17   SpO2 98%  Physical Exam Vitals signs and nursing note reviewed.  Constitutional:      Appearance: Normal appearance.  HENT:     Head: Normocephalic.     Mouth/Throat:     Pharynx: Oropharynx is clear.  Eyes:     Conjunctiva/sclera: Conjunctivae normal.  Neck:     Musculoskeletal: Normal range of motion and neck supple.  Cardiovascular:     Rate and Rhythm: Normal rate.     Heart sounds: Normal heart sounds.  Pulmonary:     Effort: Pulmonary effort is normal.     Breath sounds: Normal breath sounds.  Abdominal:     General: Bowel sounds are normal.     Tenderness: There is abdominal tenderness. There is no guarding or rebound.     Hernia: No hernia is present.  Musculoskeletal: Normal range of motion.  Skin:    General: Skin is warm and dry.  Neurological:     General: No focal deficit present.     Mental Status: He is alert and oriented to person, place, and time.  Psychiatric:        Mood and Affect: Mood normal.        Behavior: Behavior normal.      UC  Treatments / Results  Labs (all labs ordered are listed, but only abnormal results are displayed) Labs Reviewed - No data to display  EKG   Radiology No results found.  Procedures Procedures (including critical care time)  Medications Ordered in UC Medications - No data to display  Initial Impression / Assessment and Plan / UC Course  I have reviewed the triage vital signs and the nursing notes.  Pertinent labs & imaging results that were available during my care of the patient were reviewed by me and considered in my medical decision making (see chart for details).    Final Clinical Impressions(s) / UC Diagnoses   Final diagnoses:  Diverticulitis of colon   Discharge Instructions   None    ED Prescriptions    Medication Sig Dispense Auth. Provider   amoxicillin-clavulanate (AUGMENTIN) 875-125 MG tablet Take 1 tablet by mouth every 12 (twelve) hours. 20 tablet Robyn Haber, MD   traMADol (ULTRAM) 50 MG tablet Take 1 tablet (50 mg total) by mouth every 6 (six) hours as needed. 15 tablet Robyn Haber, MD     Controlled Substance Prescriptions [place] Controlled Substance Registry consulted? Not Applicable   Robyn Haber, MD 06/24/19 1115

## 2019-06-24 NOTE — ED Triage Notes (Signed)
Pt presents with left lower abdominal pain X 2 days. 

## 2019-09-21 ENCOUNTER — Other Ambulatory Visit: Payer: Self-pay

## 2019-09-21 ENCOUNTER — Ambulatory Visit (INDEPENDENT_AMBULATORY_CARE_PROVIDER_SITE_OTHER): Payer: BC Managed Care – PPO | Admitting: Urology

## 2019-09-21 DIAGNOSIS — N5201 Erectile dysfunction due to arterial insufficiency: Secondary | ICD-10-CM | POA: Diagnosis not present

## 2019-09-21 DIAGNOSIS — N401 Enlarged prostate with lower urinary tract symptoms: Secondary | ICD-10-CM

## 2019-09-21 DIAGNOSIS — R3912 Poor urinary stream: Secondary | ICD-10-CM | POA: Diagnosis not present

## 2019-12-06 ENCOUNTER — Other Ambulatory Visit: Payer: Self-pay

## 2019-12-06 DIAGNOSIS — N401 Enlarged prostate with lower urinary tract symptoms: Secondary | ICD-10-CM

## 2020-02-08 ENCOUNTER — Other Ambulatory Visit: Payer: Self-pay

## 2020-02-08 ENCOUNTER — Ambulatory Visit: Payer: Managed Care, Other (non HMO) | Attending: Internal Medicine

## 2020-02-08 DIAGNOSIS — Z20822 Contact with and (suspected) exposure to covid-19: Secondary | ICD-10-CM

## 2020-02-09 LAB — NOVEL CORONAVIRUS, NAA: SARS-CoV-2, NAA: NOT DETECTED

## 2020-05-01 ENCOUNTER — Other Ambulatory Visit (HOSPITAL_COMMUNITY): Payer: Self-pay | Admitting: Family Medicine

## 2020-05-01 ENCOUNTER — Ambulatory Visit (HOSPITAL_COMMUNITY)
Admission: RE | Admit: 2020-05-01 | Discharge: 2020-05-01 | Disposition: A | Discharge status: Home / Self Care | Payer: Managed Care, Other (non HMO) | Source: Ambulatory Visit | Attending: Family Medicine | Admitting: Family Medicine

## 2020-05-01 ENCOUNTER — Other Ambulatory Visit: Payer: Self-pay

## 2020-05-01 DIAGNOSIS — Z1389 Encounter for screening for other disorder: Secondary | ICD-10-CM

## 2020-06-04 NOTE — Progress Notes (Addendum)
CARDIOLOGY CONSULT NOTE       Patient ID: Mason Patterson MRN: [id number] DOB/AGE: Feb 24, 1961 59 y.o.  Admit date: (Not on file) Referring Physician: Phillips Odor Primary Physician: Assunta Found, MD Primary Cardiologist: New Reason for Consultation: Chest Pain   Active Problems:   * No active hospital problems. *   HPI:  59 y.o. referred by Dr Phillips Odor for chest pain. 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 He thinks this was related to pain in chest near left shoulder. He doesn't formally exercise but when active he has no discomfort   His parents are diseased. He has 2 brothers/ 1 sister but does not keep in touch with them   ROS All other systems reviewed and negative except as noted above  Past Medical History:  Diagnosis Date  . Acute medial meniscal injury of right knee   . Arthritis   . Hyperlipidemia   . Hypertension     Family History  Problem Relation Age of Onset  . Diabetes Mother   . Cancer Brother   . Cancer Brother     Social History   Socioeconomic History  . Marital status: Single    Spouse name: Not on file  . Number of children: Not on file  . Years of education: Not on file  . Highest education level: Not on file  Occupational History  . Not on file  Tobacco Use  . Smoking status: Never Smoker  . Smokeless tobacco: Never Used  Substance and Sexual Activity  . Alcohol use: No  . Drug use: No  . Sexual activity: Yes    Birth control/protection: None  Other Topics Concern  . Not on file  Social History Narrative  . Not on file   Social Determinants of Health    Financial Resource Strain:   . Difficulty of Paying Living Expenses:   Food Insecurity:   . Worried About Programme researcher, broadcasting/film/video in the Last Year:   . Barista in the Last Year:   Transportation Needs:   . Freight forwarder (Medical):   Marland Kitchen Lack of Transportation (Non-Medical):   Physical Activity:   . Days of Exercise per Week:   . Minutes of Exercise per Session:   Stress:   . Feeling of Stress :   Social Connections:   . Frequency of Communication with Friends and Family:   . Frequency of Social Gatherings with Friends and Family:   . Attends Religious Services:   . Active Member of Clubs or Organizations:   . Attends Banker Meetings:   Marland Kitchen Marital Status:   Intimate Partner Violence:   . Fear of Current or Ex-Partner:   . Emotionally Abused:   Marland Kitchen Physically Abused:   . Sexually Abused:     Past Surgical History:  Procedure Laterality Date  . CHONDROPLASTY Right 08/14/2015   Procedure: CHONDROPLASTY;  Surgeon: Marcene Corning, MD;  Location: [facility];  Service: Orthopedics;  Laterality: Right;  . KNEE ARTHROSCOPY WITH MEDIAL MENISECTOMY Right 08/14/2015   Procedure: KNEE ARTHROSCOPY WITH MEDIAL AND  LATERAL MENISECTOMY;  Surgeon: Marcene Corning, MD;  Location: [facility];  Service: Orthopedics;  Laterality: Right;      Current Outpatient Medications:  .  amoxicillin-clavulanate (AUGMENTIN) 875-125 MG tablet, Take 1 tablet by mouth every 12 (twelve) hours., Disp: 20 tablet, Rfl: 0 .  Multiple Vitamin (THERA) TABS, Take 1 tablet by mouth daily., Disp: , Rfl:  .  traMADol (ULTRAM) 50 MG tablet, Take 1 tablet (50 mg total) by mouth every 6 (six) hours as needed., Disp: 15 tablet, Rfl: 0 .  valsartan (DIOVAN) 160 MG tablet, Take 160 mg by mouth daily., Disp: , Rfl: 11    Physical Exam: There were no vitals taken for this visit.    Affect appropriate Healthy:  appears stated age HEENT: normal Neck supple with no adenopathy  JVP normal no bruits no thyromegaly Lungs clear with no wheezing and good diaphragmatic motion Heart:  S1/S2 no murmur, no rub, gallop or click PMI normal Abdomen: benighn, BS positve, no tenderness, no AAA no bruit.  No HSM or HJR Distal pulses intact with no bruits No edema Neuro non-focal Skin warm and dry No muscular weakness   Labs:   Lab Results  Component Value Date   WBC 9.7 03/23/2018   HGB 13.4 03/23/2018   HCT 39.5 03/23/2018   MCV 94.0 03/23/2018   PLT 343 03/23/2018   No results for input(s): NA, K, CL, CO2, BUN, CREATININE, CALCIUM, PROT, BILITOT, ALKPHOS, ALT, AST, GLUCOSE in the last 168 hours.  Invalid input(s): LABALBU Lab Results  Component Value Date   TROPONINI <0.30 02/15/2013   No results found for: CHOL    Radiology: No results found.  EKG: 2017 SR rate 77 normal   06/11/20 SR rate 95 normal ECG    ASSESSMENT AND PLAN:   1. Chest Pain :  Atypical likely related to job stress given CRF;s including DM will order ETT as his ECG is normal. Had normal exercise myovue in 2015 2. HLD:  Not on RX f/u with primary  3. HTN:  Currently on losartan good control  4. DM:  On glucophage A1c over 7 f/u primary   ETT F/U PRN if normal   Signed: Charlton Haws 06/04/2020, 12:41 PM

## 2020-06-11 ENCOUNTER — Encounter: Payer: Self-pay | Admitting: Cardiovascular Disease

## 2020-06-11 ENCOUNTER — Ambulatory Visit (INDEPENDENT_AMBULATORY_CARE_PROVIDER_SITE_OTHER): Payer: Managed Care, Other (non HMO) | Admitting: Cardiovascular Disease

## 2020-06-11 ENCOUNTER — Other Ambulatory Visit: Payer: Self-pay

## 2020-06-11 VITALS — BP 140/80 | HR 95 | Ht 69.5 in | Wt 206.9 lb

## 2020-06-11 DIAGNOSIS — R079 Chest pain, unspecified: Secondary | ICD-10-CM

## 2020-06-11 NOTE — Patient Instructions (Signed)
Medication Instructions:  Your physician recommends that you continue on your current medications as directed. Please refer to the Current Medication list given to you today.  *If you need a refill on your cardiac medications before your next appointment, please call your pharmacy*   Lab Work: None today If you have labs (blood work) drawn today and your tests are completely normal, you will receive your results only by: . MyChart Message (if you have MyChart) OR . A paper copy in the mail If you have any lab test that is abnormal or we need to change your treatment, we will call you to review the results.   Testing/Procedures: Your physician has requested that you have an exercise tolerance test. For further information please visit [email or web address]. Please also follow instruction sheet, as given.     Follow-Up: At [facility], you and your health needs are our priority.  As part of our continuing mission to provide you with exceptional heart care, we have created designated Provider Care Teams.  These Care Teams include your primary Cardiologist (physician) and Advanced Practice Providers (APPs -  Physician Assistants and Nurse Practitioners) who all work together to provide you with the care you need, when you need it.  We recommend signing up for the patient portal called "MyChart".  Sign up information is provided on this After Visit Summary.  MyChart is used to connect with patients for Virtual Visits (Telemedicine).  Patients are able to view lab/test results, encounter notes, upcoming appointments, etc.  Non-urgent messages can be sent to your provider as well.   To learn more about what you can do with MyChart, go to [email or web address].    Your next appointment: As needed with Dr.Nishan    We will call you with results.      Thank you for choosing [facility] !        

## 2020-06-19 ENCOUNTER — Ambulatory Visit (HOSPITAL_COMMUNITY): Payer: Managed Care, Other (non HMO)

## 2020-09-04 ENCOUNTER — Other Ambulatory Visit: Payer: Self-pay

## 2020-09-04 ENCOUNTER — Other Ambulatory Visit: Payer: Managed Care, Other (non HMO)

## 2020-09-04 DIAGNOSIS — Z20822 Contact with and (suspected) exposure to covid-19: Secondary | ICD-10-CM

## 2020-09-05 LAB — SARS-COV-2, NAA 2 DAY TAT

## 2020-09-05 LAB — NOVEL CORONAVIRUS, NAA: SARS-CoV-2, NAA: NOT DETECTED

## 2020-09-05 LAB — SPECIMEN STATUS REPORT

## 2020-11-13 ENCOUNTER — Other Ambulatory Visit: Payer: Self-pay | Admitting: Family Medicine

## 2022-02-17 ENCOUNTER — Encounter (INDEPENDENT_AMBULATORY_CARE_PROVIDER_SITE_OTHER): Payer: Self-pay | Admitting: *Deleted

## 2022-03-26 ENCOUNTER — Encounter (INDEPENDENT_AMBULATORY_CARE_PROVIDER_SITE_OTHER): Payer: Self-pay | Admitting: Gastroenterology

## 2022-04-15 ENCOUNTER — Other Ambulatory Visit (INDEPENDENT_AMBULATORY_CARE_PROVIDER_SITE_OTHER): Payer: Self-pay

## 2022-04-15 ENCOUNTER — Ambulatory Visit (INDEPENDENT_AMBULATORY_CARE_PROVIDER_SITE_OTHER): Payer: 59 | Admitting: Gastroenterology

## 2022-04-15 ENCOUNTER — Encounter (INDEPENDENT_AMBULATORY_CARE_PROVIDER_SITE_OTHER): Payer: Self-pay

## 2022-04-15 ENCOUNTER — Telehealth (INDEPENDENT_AMBULATORY_CARE_PROVIDER_SITE_OTHER): Payer: Self-pay

## 2022-04-15 ENCOUNTER — Ambulatory Visit (INDEPENDENT_AMBULATORY_CARE_PROVIDER_SITE_OTHER): Payer: 59 | Admitting: Urology

## 2022-04-15 ENCOUNTER — Telehealth: Payer: Self-pay

## 2022-04-15 ENCOUNTER — Encounter (INDEPENDENT_AMBULATORY_CARE_PROVIDER_SITE_OTHER): Payer: Self-pay | Admitting: Gastroenterology

## 2022-04-15 ENCOUNTER — Encounter: Payer: Self-pay | Admitting: Urology

## 2022-04-15 VITALS — BP 157/90 | HR 102 | Ht 69.5 in | Wt 205.0 lb

## 2022-04-15 DIAGNOSIS — N529 Male erectile dysfunction, unspecified: Secondary | ICD-10-CM

## 2022-04-15 DIAGNOSIS — Z800 Family history of malignant neoplasm of digestive organs: Secondary | ICD-10-CM | POA: Diagnosis not present

## 2022-04-15 DIAGNOSIS — N138 Other obstructive and reflux uropathy: Secondary | ICD-10-CM | POA: Diagnosis not present

## 2022-04-15 DIAGNOSIS — K219 Gastro-esophageal reflux disease without esophagitis: Secondary | ICD-10-CM

## 2022-04-15 DIAGNOSIS — R140 Abdominal distension (gaseous): Secondary | ICD-10-CM | POA: Diagnosis not present

## 2022-04-15 DIAGNOSIS — N401 Enlarged prostate with lower urinary tract symptoms: Secondary | ICD-10-CM | POA: Diagnosis not present

## 2022-04-15 LAB — URINALYSIS, ROUTINE W REFLEX MICROSCOPIC
Bilirubin, UA: NEGATIVE
Glucose, UA: NEGATIVE
Ketones, UA: NEGATIVE
Leukocytes,UA: NEGATIVE
Nitrite, UA: NEGATIVE
Protein,UA: NEGATIVE
RBC, UA: NEGATIVE
Specific Gravity, UA: 1.02 (ref 1.005–1.030)
Urobilinogen, Ur: 1 mg/dL (ref 0.2–1.0)
pH, UA: 6.5 (ref 5.0–7.5)

## 2022-04-15 MED ORDER — OMEPRAZOLE 40 MG PO CPDR: 40.0000 mg | DELAYED_RELEASE_CAPSULE | Freq: Every day | ORAL | 3 refills | Status: DC

## 2022-04-15 MED ORDER — SILDENAFIL CITRATE 20 MG PO TABS: ORAL_TABLET | ORAL | 11 refills | Status: DC

## 2022-04-15 MED ORDER — PEG 3350-KCL-NA BICARB-NACL 420 G PO SOLR: 4000.0000 mL | ORAL | 0 refills | Status: DC

## 2022-04-15 NOTE — Progress Notes (Signed)
   Assessment: 1. BPH with obstruction/lower urinary tract symptoms   2. Organic impotence     Plan: PSA today Refill sildenafil 20 mg 2-5 tabs prn intercourse. Return to office in 1 year  Chief Complaint: Chief Complaint  Patient presents with   Erectile Dysfunction    HPI: Mason Patterson is a 61 y.o. male who presents for continued evaluation of BPH with lower urinary tract symptoms.  He was previously seen by Dr. Annabell Howells for BPH and erectile dysfunction in October 2020.  He was not on any medication for his urinary symptoms at that time.  His main complaint was a weak stream. PSA from 10/20: 1.9  He was using sildenafil 20 mg 1-2 tabs as needed for management of his erectile dysfunction.  He returns today for follow-up.  His urinary symptoms are stable.  No dysuria or gross hematuria. IPSS = 4 today. He continues to use sildenafil for his erectile dysfunction with satisfactory results.  He has had to slightly increase the dosage for efficacy.  He is requesting a refill.  Portions of the above documentation were copied from a prior visit for review purposes only.  Allergies: Allergies  Allergen Reactions   Darvocet [Propoxyphene N-Acetaminophen] Other (See Comments)    REACTION IS UNKNOWN   Strawberry Extract Rash    PMH: Past Medical History:  Diagnosis Date   Acute medial meniscal injury of right knee    Arthritis    Hyperlipidemia    Hypertension     PSH: Past Surgical History:  Procedure Laterality Date   CHONDROPLASTY Right 08/14/2015   Procedure: CHONDROPLASTY;  Surgeon: Marcene Corning, MD;  Location: [facility];  Service: Orthopedics;  Laterality: Right;   KNEE ARTHROSCOPY WITH MEDIAL MENISECTOMY Right 08/14/2015   Procedure: KNEE ARTHROSCOPY WITH MEDIAL AND LATERAL MENISECTOMY;  Surgeon: Marcene Corning, MD;  Location: [facility];  Service: Orthopedics;  Laterality: Right;    SH: Social History   Tobacco Use   Smoking  status: Never   Smokeless tobacco: Never  Vaping Use   Vaping Use: Never used  Substance Use Topics   Alcohol use: Yes    Comment: occasionally   Drug use: No    ROS: Constitutional:  Negative for fever, chills, weight loss CV: Negative for chest pain, previous MI, hypertension Respiratory:  Negative for shortness of breath, wheezing, sleep apnea, frequent cough GI:  Negative for nausea, vomiting, bloody stool, GERD  PE: BP (!) 157/90   Pulse (!) 102   Ht 5' 9.5" (1.765 m)   Wt 205 lb (93 kg)   BMI 29.84 kg/m  GENERAL APPEARANCE:  Well appearing, well developed, well nourished, NAD HEENT:  Atraumatic, normocephalic, oropharynx clear NECK:  Supple without lymphadenopathy or thyromegaly ABDOMEN:  Soft, non-tender, no masses EXTREMITIES:  Moves all extremities well, without clubbing, cyanosis, or edema NEUROLOGIC:  Alert and oriented x 3, normal gait, CN II-XII grossly intact MENTAL STATUS:  appropriate BACK:  Non-tender to palpation, No CVAT SKIN:  Warm, dry, and intact GU: Prostate: 40 gm, NT, no nodules Rectum: Normal tone,  no masses or tenderness  Results: U/A dipstick negative

## 2022-04-15 NOTE — Telephone Encounter (Signed)
Melisia Leming Ann Anubis Fundora, CMA  ?

## 2022-04-15 NOTE — Patient Instructions (Signed)
Schedule EGD and colonoscopy Start omeprazole 40 mg qday Explained presumed etiology of reflux symptoms. Instruction provided in the use of antireflux medication - patient should take medication in the morning 30-45 minutes before eating breakfast. Discussed avoidance of eating within 2 hours of lying down to sleep and benefit of blocks to elevate head of bed. Also, will benefit from avoiding carbonated drinks/sodas or food that has tomatoes, spicy or greasy food.

## 2022-04-15 NOTE — Progress Notes (Signed)
Katrinka Blazing, M.D. Gastroenterology & Hepatology [facility] [address] [place], [place] [address] Primary Care Physician: Assunta Found, MD [address] [place] [address]  Referring MD: PCP  Chief Complaint:  heartburn and abdominal gurgling.  History of Present Illness: Mason Patterson is a 61 y.o. male with past medical history of hyperlipidemia, hypertension, GERD, obesity, who presents for evaluation of  heartburn and abdominal gurgling.  Patient reports that he has felt some episodes of recurrent episodes of burning in his mid chest which depends on the amount of food that he eats. He was taking PPI (possibly pantoprazole) for 1 month in the morning just before his breakfast but he did not feel any improvement.  No dysphagia or odynophagia. Patient was seen at [facility] (Dr. Bosie Clos) for management of GERD for multiple years. He was put on Protonix per medical record but the patient reports that it did not help. He could not keep following in this office due to insurance coverage. He currently has a bad taste in his mouth every day. He is having symptoms more than twice a week - tries to avoid sodas or acid foods to decrease his symptoms. He burps frequently.  He has also presented some "gurgling noises and movement of his bowels" for the last few months which have made him concerned. Has presented some abdominal bloating intermittently. His stool is well formed, has 2-3 Bms per day without melena or red blood in stool. Endorses some occasional nausea but not frequently. The patient denies having any vomiting, fever, chills, hematemesis, abdominal pain, diarrhea, jaundice, pruritus . Has lost a few lb in the last year as he has changed some of his diet.  Patient had an episode of acute uncomplicated sigmoid colon diverticulitis in 03/23/2018 which was treated with oral antibiotics.  He was also empirically treated for a  second episode on 06/24/2019.  Last [id number] 5-7 years ago, normal per patient - performed by Dr. Bosie Clos - no report available Last Colonoscopy: June 2021, believes he had a polyp removed by Dr. Bosie Clos - no report available Recommended to repeat in 2 years  FHx: neg for any gastrointestinal/liver disease, brother (diagnosed around age 37) and 2 aunts had colon cancer Social: neg smoking, alcohol or illicit drug use Surgical: no abdominal surgeries  Past Medical History: Past Medical History:  Diagnosis Date   Acute medial meniscal injury of right knee    Arthritis    Hyperlipidemia    Hypertension     Past Surgical History: Past Surgical History:  Procedure Laterality Date   CHONDROPLASTY Right 08/14/2015   Procedure: CHONDROPLASTY;  Surgeon: Marcene Corning, MD;  Location: [facility];  Service: Orthopedics;  Laterality: Right;   KNEE ARTHROSCOPY WITH MEDIAL MENISECTOMY Right 08/14/2015   Procedure: KNEE ARTHROSCOPY WITH MEDIAL AND LATERAL MENISECTOMY;  Surgeon: Marcene Corning, MD;  Location: [facility];  Service: Orthopedics;  Laterality: Right;    Family History: Family History  Problem Relation Age of Onset   Diabetes Mother    Cancer Brother    Cancer Brother     Social History: Social History   Tobacco Use  Smoking Status Never  Smokeless Tobacco Never   Social History   Substance and Sexual Activity  Alcohol Use Yes   Comment: occasionally   Social History   Substance and Sexual Activity  Drug Use No    Allergies: Allergies  Allergen Reactions   Darvocet [Propoxyphene N-Acetaminophen] Other (See  Comments)    REACTION IS UNKNOWN   Strawberry Extract Rash    Medications: Current Outpatient Medications  Medication Sig Dispense Refill   losartan (COZAAR) 100 MG tablet Take 50 mg by mouth daily.     Multiple Vitamin (THERA) TABS Take 1 tablet by mouth daily.     rosuvastatin (CRESTOR) 10 MG tablet Take 10 mg by  mouth daily.     No current facility-administered medications for this visit.    Review of Systems: GENERAL: negative for malaise, night sweats HEENT: No changes in hearing or vision, no nose bleeds or other nasal problems. NECK: Negative for lumps, goiter, pain and significant neck swelling RESPIRATORY: Negative for cough, wheezing CARDIOVASCULAR: Negative for chest pain, leg swelling, palpitations, orthopnea GI: SEE HPI MUSCULOSKELETAL: Negative for joint pain or swelling, back pain, and muscle pain. SKIN: Negative for lesions, rash PSYCH: Negative for sleep disturbance, mood disorder and recent psychosocial stressors. HEMATOLOGY Negative for prolonged bleeding, bruising easily, and swollen nodes. ENDOCRINE: Negative for cold or heat intolerance, polyuria, polydipsia and goiter. NEURO: negative for tremor, gait imbalance, syncope and seizures. The remainder of the review of systems is noncontributory.   Physical Exam: BP (!) 158/88 (BP Location: Left Arm, Patient Position: Sitting, Cuff Size: Large)   Pulse (!) 106   Temp 98.1 F (36.7 C) (Oral)   Ht 5' 9.5" (1.765 m)   Wt 205 lb (93 kg)   BMI 29.84 kg/m  GENERAL: The patient is AO x3, in no acute distress. HEENT: Head is normocephalic and atraumatic. EOMI are intact. Mouth is well hydrated and without lesions. NECK: Supple. No masses LUNGS: Clear to auscultation. No presence of rhonchi/wheezing/rales. Adequate chest expansion HEART: RRR, normal s1 and s2. ABDOMEN: Soft, nontender, no guarding, no peritoneal signs, and nondistended. BS + are normal. No masses. EXTREMITIES: Without any cyanosis, clubbing, rash, lesions or edema. NEUROLOGIC: AOx3, no focal motor deficit. SKIN: no jaundice, no rashes   Imaging/Labs: as above  I personally reviewed and interpreted the available labs, imaging and endoscopic files.  Impression and Plan: TREVAR BOEHRINGER is a 61 y.o. male with past medical history of hyperlipidemia,  hypertension, GERD, obesity, who presents for evaluation of  heartburn and abdominal gurgling. He has presented recurrent episodes of abdominal gurgling without presence of any other associated gastrointestinal symptoms and no red flag signs, which is reassuring. We will explore his symptoms further with an EGD and small bowel biopsies for now given his episodes of bloating. I advised him to start taking omeprazole on a correct fashion to allow adequate control of his possible reflux symptoms.  He is due for surveillance colonoscopy based on most recent colonoscopy and family history of colon cancer.  - Schedule EGD and colonoscopy - Start omeprazole 40 mg qday - Explained presumed etiology of reflux symptoms. Instruction provided in the use of antireflux medication - patient should take medication in the morning 30-45 minutes before eating breakfast. Discussed avoidance of eating within 2 hours of lying down to sleep and benefit of blocks to elevate head of bed. Also, will benefit from avoiding carbonated drinks/sodas or food that has tomatoes, spicy or greasy food.  All questions were answered.      Katrinka Blazing, MD Gastroenterology and Hepatology [facility]]

## 2022-04-15 NOTE — Telephone Encounter (Signed)
Patient needing a Rx refill. Did not leave name of Rx.  Thanks, Rosey Bath

## 2022-04-16 ENCOUNTER — Encounter: Payer: Self-pay | Admitting: Urology

## 2022-04-16 LAB — CELIAC DISEASE PANEL
(tTG) Ab, IgA: <1 U/mL
(tTG) Ab, IgG: <1 U/mL
Gliadin IgA: <1 U/mL
Gliadin IgG: <1 U/mL
Immunoglobulin A: 132 mg/dL (ref 47–310)

## 2022-04-16 LAB — PSA: Prostate Specific Ag, Serum: 3.1 ng/mL (ref 0.0–4.0)

## 2022-04-16 NOTE — Telephone Encounter (Signed)
Patient came for visit on 05/26, refill rx on 5/25.

## 2022-04-29 ENCOUNTER — Ambulatory Visit (INDEPENDENT_AMBULATORY_CARE_PROVIDER_SITE_OTHER): Payer: Managed Care, Other (non HMO) | Admitting: Gastroenterology

## 2022-05-05 DIAGNOSIS — T63444A Toxic effect of venom of bees, undetermined, initial encounter: Secondary | ICD-10-CM | POA: Diagnosis not present

## 2022-05-05 DIAGNOSIS — Z23 Encounter for immunization: Secondary | ICD-10-CM | POA: Diagnosis not present

## 2022-05-05 DIAGNOSIS — R222 Localized swelling, mass and lump, trunk: Secondary | ICD-10-CM | POA: Diagnosis not present

## 2022-05-21 ENCOUNTER — Encounter (INDEPENDENT_AMBULATORY_CARE_PROVIDER_SITE_OTHER): Payer: Self-pay

## 2022-08-06 ENCOUNTER — Ambulatory Visit (HOSPITAL_COMMUNITY): Admission: RE | Admit: 2022-08-06 | Payer: 59 | Source: Home / Self Care | Admitting: Gastroenterology

## 2022-08-06 ENCOUNTER — Encounter (HOSPITAL_COMMUNITY): Admission: RE | Payer: Self-pay | Source: Home / Self Care

## 2022-08-06 SURGERY — COLONOSCOPY WITH PROPOFOL
Anesthesia: Monitor Anesthesia Care

## 2022-09-03 DIAGNOSIS — E6609 Other obesity due to excess calories: Secondary | ICD-10-CM | POA: Diagnosis not present

## 2022-09-03 DIAGNOSIS — M546 Pain in thoracic spine: Secondary | ICD-10-CM | POA: Diagnosis not present

## 2022-09-03 DIAGNOSIS — Z6831 Body mass index (BMI) 31.0-31.9, adult: Secondary | ICD-10-CM | POA: Diagnosis not present

## 2022-09-16 DIAGNOSIS — R350 Frequency of micturition: Secondary | ICD-10-CM | POA: Diagnosis not present

## 2022-09-16 DIAGNOSIS — E6609 Other obesity due to excess calories: Secondary | ICD-10-CM | POA: Diagnosis not present

## 2022-09-16 DIAGNOSIS — Z6830 Body mass index (BMI) 30.0-30.9, adult: Secondary | ICD-10-CM | POA: Diagnosis not present

## 2022-09-16 DIAGNOSIS — E1165 Type 2 diabetes mellitus with hyperglycemia: Secondary | ICD-10-CM | POA: Diagnosis not present

## 2022-09-17 DIAGNOSIS — E1165 Type 2 diabetes mellitus with hyperglycemia: Secondary | ICD-10-CM | POA: Diagnosis not present

## 2022-09-17 DIAGNOSIS — R350 Frequency of micturition: Secondary | ICD-10-CM | POA: Diagnosis not present

## 2022-09-30 DIAGNOSIS — R9431 Abnormal electrocardiogram [ECG] [EKG]: Secondary | ICD-10-CM | POA: Diagnosis not present

## 2022-09-30 DIAGNOSIS — R000 Tachycardia, unspecified: Secondary | ICD-10-CM | POA: Diagnosis not present

## 2022-09-30 DIAGNOSIS — E1165 Type 2 diabetes mellitus with hyperglycemia: Secondary | ICD-10-CM | POA: Diagnosis not present

## 2022-09-30 DIAGNOSIS — E7849 Other hyperlipidemia: Secondary | ICD-10-CM | POA: Diagnosis not present

## 2022-10-01 LAB — BASIC METABOLIC PANEL
BUN: 10 (ref 4–21)
Creatinine: 0.8 (ref 0.6–1.3)
Glucose: 206

## 2022-10-01 LAB — COMPREHENSIVE METABOLIC PANEL: eGFR: 100

## 2022-10-01 LAB — TSH: TSH: 1.26 (ref 0.41–5.90)

## 2022-10-01 LAB — LIPID PANEL
LDL Cholesterol: 182
Triglycerides: 150 (ref 40–160)

## 2022-10-02 ENCOUNTER — Encounter (INDEPENDENT_AMBULATORY_CARE_PROVIDER_SITE_OTHER): Payer: Self-pay | Admitting: Gastroenterology

## 2022-10-07 ENCOUNTER — Telehealth: Payer: Self-pay | Admitting: Cardiovascular Disease

## 2022-10-07 NOTE — Telephone Encounter (Signed)
Patient is requesting to switch providers from Dr. Eden Emms to Dr. Jenene Slicker.   Please advise

## 2022-11-02 ENCOUNTER — Ambulatory Visit: Payer: Self-pay | Admitting: Nurse Practitioner

## 2022-11-04 DIAGNOSIS — Z008 Encounter for other general examination: Secondary | ICD-10-CM | POA: Diagnosis not present

## 2022-11-10 ENCOUNTER — Ambulatory Visit: Payer: Self-pay | Admitting: Internal Medicine

## 2022-12-07 ENCOUNTER — Ambulatory Visit: Payer: Self-pay | Admitting: Nurse Practitioner

## 2022-12-28 ENCOUNTER — Encounter: Payer: Self-pay | Admitting: Internal Medicine

## 2022-12-28 ENCOUNTER — Ambulatory Visit: Payer: 59 | Attending: Internal Medicine | Admitting: Internal Medicine

## 2022-12-28 VITALS — BP 158/90 | HR 94 | Ht 70.0 in | Wt 205.0 lb

## 2022-12-28 DIAGNOSIS — Z136 Encounter for screening for cardiovascular disorders: Secondary | ICD-10-CM | POA: Insufficient documentation

## 2022-12-28 DIAGNOSIS — I10 Essential (primary) hypertension: Secondary | ICD-10-CM | POA: Diagnosis not present

## 2022-12-28 DIAGNOSIS — Z79899 Other long term (current) drug therapy: Secondary | ICD-10-CM

## 2022-12-28 DIAGNOSIS — R000 Tachycardia, unspecified: Secondary | ICD-10-CM | POA: Diagnosis not present

## 2022-12-28 DIAGNOSIS — E7849 Other hyperlipidemia: Secondary | ICD-10-CM | POA: Diagnosis not present

## 2022-12-28 DIAGNOSIS — E785 Hyperlipidemia, unspecified: Secondary | ICD-10-CM | POA: Insufficient documentation

## 2022-12-28 MED ORDER — CHLORTHALIDONE 25 MG PO TABS: 25.0000 mg | ORAL_TABLET | Freq: Every day | ORAL | 3 refills | Status: DC

## 2022-12-28 NOTE — Patient Instructions (Addendum)
Medication Instructions:  Your physician has recommended you make the following change in your medication:  Start chlorthalidone 25 mg daily Continue other medications the same  Labwork: BMET in 5 days 01/03/2023) [facility] Non-fasting  Testing/Procedures: Calcium Score  Follow-Up: Your physician recommends that you schedule a follow-up appointment in: as needed  Any Other Special Instructions Will Be Listed Below (If Applicable). Please get a home blood pressure monitor Your physician has requested that you regularly monitor and record your blood pressure readings at home. Please use the same machine at the same time of day to check your readings and record them. Take your your family doctor to review.  If you need a refill on your cardiac medications before your next appointment, please call your pharmacy.

## 2022-12-28 NOTE — Progress Notes (Signed)
Cardiology Office Note  Date: 12/28/2022   ID: Mason Patterson, DOB [birth year], MRN [id number]  PCP:  Sharilyn Sites, MD  Cardiologist:  Chalmers Guest, MD Electrophysiologist:  None   Reason for Office Visit: Elevated heart rates at the request of Dr Hilma Favors   History of Present Illness: Mason Patterson is a 62 y.o. male known to have HTN, DM2, HLD presented to the cardiology clinic for evaluation of elevated heart rates.  Patient was originally referred to cardiology clinic in 2021 for atypical chest pain for which exercise tolerance test was recommended but I do not see the result.  His chest pain is resolved eventually. However his resting heart rate has been high, 94 bpm today and said he is under a lot of stress.  He denies having any palpitations, chest pains, SOB, dizziness, lightness, syncope and leg swelling.  Past Medical History:  Diagnosis Date   Acute medial meniscal injury of right knee    Arthritis    Hyperlipidemia    Hypertension     Past Surgical History:  Procedure Laterality Date   CHONDROPLASTY Right 08/14/2015   Procedure: CHONDROPLASTY;  Surgeon: Melrose Nakayama, MD;  Location: [place];  Service: Orthopedics;  Laterality: Right;   KNEE ARTHROSCOPY WITH MEDIAL MENISECTOMY Right 08/14/2015   Procedure: KNEE ARTHROSCOPY WITH MEDIAL AND LATERAL MENISECTOMY;  Surgeon: Melrose Nakayama, MD;  Location: [facility];  Service: Orthopedics;  Laterality: Right;    Current Outpatient Medications  Medication Sig Dispense Refill   chlorthalidone (HYGROTON) 25 MG tablet Take 1 tablet (25 mg total) by mouth daily. 30 tablet 3   losartan (COZAAR) 100 MG tablet Take 100 mg by mouth daily.     rosuvastatin (CRESTOR) 10 MG tablet Take 10 mg by mouth at bedtime.     sildenafil (REVATIO) 20 MG tablet Take 2-5 tablets by mouth 30-60 minutes before intercourse (Patient taking differently: as needed. Take 2-5 tablets by mouth 30-60  minutes before intercourse) 50 tablet 11   omeprazole (PRILOSEC) 40 MG capsule Take 1 capsule (40 mg total) by mouth daily. (Patient not taking: Reported on 12/28/2022) 90 capsule 3   polyethylene glycol-electrolytes (TRILYTE) 420 g solution Take 4,000 mLs by mouth as directed. (Patient not taking: Reported on 04/15/2022) 4000 mL 0   No current facility-administered medications for this visit.   Allergies:  Patient has no active allergies.   Social History: The patient  reports that he has never smoked. He has never used smokeless tobacco. He reports current alcohol use. He reports that he does not use drugs.   Family History: The patient's family history includes Cancer in his brother and brother; Diabetes in his mother.   ROS:  Please see the history of present illness. Otherwise, complete review of systems is positive for none.  All other systems are reviewed and negative.   Physical Exam: VS:  BP (!) 158/90   Pulse 94   Ht 5\' 10"  (1.778 m)   Wt 205 lb (93 kg)   SpO2 96%   BMI 29.41 kg/m , BMI Body mass index is 29.41 kg/m.  Wt Readings from Last 3 Encounters:  12/28/22 205 lb (93 kg)  04/15/22 205 lb (93 kg)  04/15/22 205 lb (93 kg)    General: Patient appears comfortable at rest. HEENT: Conjunctiva and lids normal, oropharynx clear with moist mucosa. Neck: Supple, no elevated JVP or carotid bruits, no thyromegaly. Lungs: Clear to auscultation, nonlabored breathing at rest. Cardiac: Regular  rate and rhythm, no S3 or significant systolic murmur, no pericardial rub. Abdomen: Soft, nontender, no hepatomegaly, bowel sounds present, no guarding or rebound. Extremities: No pitting edema, distal pulses 2+. Skin: Warm and dry. Musculoskeletal: No kyphosis. Neuropsychiatric: Alert and oriented x3, affect grossly appropriate.  ECG:  An ECG dated 12/28/2022 was personally reviewed today and demonstrated:  Normal sinus rhythm, Q waves in inferior leads, LVH with repolarization  abnormality.  Recent Labwork: No results found for requested labs within last 365 days.  No results found for: "CHOL", "TRIG", "HDL", "CHOLHDL", "VLDL", "LDLCALC", "LDLDIRECT"  Other Studies Reviewed Today:   Assessment and Plan: Patient is a 62 year old M known to have HTN, DM2, HLD was referred to cardiology clinic for evaluation of elevated heart rates.  # Resting elevated heart rates, high 90s to 100 likely secondary to stress -Patient has resting heart rates as high as 90-100s.  This could be secondary to ongoing stress in his life. He denies having any palpitations, SOB, dizziness/lightheadedness or syncope related to high heart rates. In the absence of symptoms, no further cardiac testing is warranted at this time and no indication of AV nodal agents. Treat the lung etiology, stress. Educated about stress reduction techniques like yoga, meditation, deep breathing techniques, exercise etc. Patient is a Actuary.  # Screening for CAD -Obtain CT calcium scoring of coronaries  # HTN, partially controlled -Patient does not check his blood pressures at home.  Instructed him to obtain over-the-counter blood pressure cuff/machine and start checking his blood pressures. -Continue losartan 100 mg once daily and start chlorthalidone 25 mg once daily.  BMP in 5 days.  # HLD -Continue rosuvastatin 10 mg nightly.  I have spent a total of 45 minutes with patient reviewing chart, EKGs, labs and examining patient as well as establishing an assessment and plan that was discussed with the patient.  > 50% of time was spent in direct patient care.     Medication Adjustments/Labs and Tests Ordered: Current medicines are reviewed at length with the patient today.  Concerns regarding medicines are outlined above.   Tests Ordered: Orders Placed This Encounter  Procedures   CT CARDIAC SCORING (SELF PAY ONLY)   Basic metabolic panel   EKG [id number]    Medication Changes: Meds ordered  this encounter  Medications   chlorthalidone (HYGROTON) 25 MG tablet    Sig: Take 1 tablet (25 mg total) by mouth daily.    Dispense:  30 tablet    Refill:  3    12/28/2022 NEW    Disposition:  Follow up prn  Signed Aadarsh Cozort Fidel Levy, MD, 12/28/2022 7:31 PM    [place] [facility] [place], [place], [place] [address]

## 2023-01-03 DIAGNOSIS — Z79899 Other long term (current) drug therapy: Secondary | ICD-10-CM | POA: Diagnosis not present

## 2023-01-04 LAB — BASIC METABOLIC PANEL
BUN/Creatinine Ratio: 13 (ref 10–24)
BUN: 12 mg/dL (ref 8–27)
CO2: 22 mmol/L (ref 20–29)
Calcium: 9.8 mg/dL (ref 8.6–10.2)
Chloride: 96 mmol/L (ref 96–106)
Creatinine, Ser: 0.89 mg/dL (ref 0.76–1.27)
Glucose: 152 mg/dL — ABNORMAL HIGH (ref 70–99)
Potassium: 4.4 mmol/L (ref 3.5–5.2)
Sodium: 137 mmol/L (ref 134–144)
eGFR: 98 mL/min/{1.73_m2} (ref >59)

## 2023-01-11 ENCOUNTER — Ambulatory Visit (HOSPITAL_COMMUNITY)
Admission: RE | Admit: 2023-01-11 | Discharge: 2023-01-11 | Disposition: A | Discharge status: Home / Self Care | Payer: 59 | Source: Ambulatory Visit | Attending: Internal Medicine | Admitting: Internal Medicine

## 2023-01-11 DIAGNOSIS — Z136 Encounter for screening for cardiovascular disorders: Secondary | ICD-10-CM | POA: Insufficient documentation

## 2023-01-24 ENCOUNTER — Telehealth (INDEPENDENT_AMBULATORY_CARE_PROVIDER_SITE_OTHER): Payer: Self-pay | Admitting: *Deleted

## 2023-01-24 NOTE — Telephone Encounter (Signed)
Please reschedule for room 1, he also needs a follow up appointment to discuss how are his symptoms. Thanks

## 2023-01-24 NOTE — Telephone Encounter (Signed)
Received VM requesting to reschedule TCS/EGD from last year. Patient was seen in May 2023 and told to schedule procedure from there. Please advise if he can be triaged or needs OV?

## 2023-01-25 NOTE — Telephone Encounter (Signed)
Mason Patterson, please schedule appt and then we can schedule once seen

## 2023-01-27 ENCOUNTER — Ambulatory Visit (INDEPENDENT_AMBULATORY_CARE_PROVIDER_SITE_OTHER): Payer: 59 | Admitting: Gastroenterology

## 2023-01-27 ENCOUNTER — Encounter (INDEPENDENT_AMBULATORY_CARE_PROVIDER_SITE_OTHER): Payer: Self-pay | Admitting: Gastroenterology

## 2023-01-27 VITALS — BP 125/76 | HR 122 | Temp 97.8°F | Ht 70.0 in | Wt 198.7 lb

## 2023-01-27 DIAGNOSIS — R1310 Dysphagia, unspecified: Secondary | ICD-10-CM | POA: Diagnosis not present

## 2023-01-27 DIAGNOSIS — Z800 Family history of malignant neoplasm of digestive organs: Secondary | ICD-10-CM

## 2023-01-27 DIAGNOSIS — K5732 Diverticulitis of large intestine without perforation or abscess without bleeding: Secondary | ICD-10-CM

## 2023-01-27 MED ORDER — PEG 3350-KCL-NA BICARB-NACL 420 G PO SOLR: 4000.0000 mL | Freq: Once | ORAL | 0 refills | Status: AC

## 2023-01-27 NOTE — Patient Instructions (Addendum)
Please complete antibiotic course We will get you scheduled for EGD and colonoscopy for further evaluation of your symptoms Please continue to chew food well, take small bites, sips of liquids between bite and avoid thicker, drier foods  Follow up 3 months  It was a pleasure to see you today. I want to create trusting relationships with patients and provide genuine, compassionate, and quality care. I truly value your feedback! please be on the lookout for a survey regarding your visit with me today. I appreciate your input about our visit and your time in completing this!    Anchor Mason L. Alver Sorrow, MSN, APRN, AGNP-C Adult-Gerontology Nurse Practitioner [facility] Gastroenterology at [facility]

## 2023-01-27 NOTE — Progress Notes (Addendum)
Referring Provider: Assunta Found, MD Primary Care Physician:  Assunta Found, MD Primary GI Physician: Dr Levon Hedger   Chief Complaint  Patient presents with   Gastroesophageal Reflux    Patient wants to get EGD and TCS scheduled. Reports he is on antibiotic for diverticulitis prescribed by pcp flagyl and cipro. Has two days left. Left lower side pain is better. Wants EGD done also. Feels like food settles and takes awhile to go down.     HPI:   Mason Patterson is a 62 y.o. male with past medical history of hyperlipidemia, hypertension, GERD, obesity   Patient presenting today for evaluation for scheduling EGD/Colonoscopy   Last seen may 2023, at that time, having burning in his chest, disguesia and belching. Also reports gurgiling in the stomach and abdominal bloating. Having 2-3 BMs per day  Recommended to schedule EGD/Colonoscopy, start omeprazole 40mg  daily.  Celiac testing in 2023 negative.   Present:  He notes that he feels that food sticks in mid chest when he eats.  this occurs almost daily. He feels that he is having some issues with acid reflux at times. Continues to have gurgling in his stomach and bloating. He denies changes to appetite or weight loss. He notes some nausea at times when he eats. Feels that liquids and pills also tend to pass down slow at times as well.   He was recently started on cipro and flagyl for suspect diverticulitis, as he developed LLQ pain. he is having more frequent BMs but states this is related to his metformin. Denies rectal bleeding. He has 3-5 BMs per day, if he skips metformin stools will go back to solid. No melena. Denies any fevers or chills with onset of LLQ pain. Pain has Improved since starting abx therapy, he has 1-2 doses left of his antibiotics.   Notably, Patient had an episode of acute uncomplicated sigmoid colon diverticulitis in 03/23/2018 which was treated with oral antibiotics.  He was also empirically treated for a second  episode on 06/24/2019.   Last [id number] 5-7 years ago, normal per patient - performed by Dr. Bosie Clos - no report available Last Colonoscopy: June 2021, believes he had a polyp removed by Dr. Bosie Clos - no report available Recommended to repeat in 2 years   Past Medical History:  Diagnosis Date   Acute medial meniscal injury of right knee    Arthritis    Hyperlipidemia    Hypertension     Past Surgical History:  Procedure Laterality Date   CHONDROPLASTY Right 08/14/2015   Procedure: CHONDROPLASTY;  Surgeon: Marcene Corning, MD;  Location: [facility];  Service: Orthopedics;  Laterality: Right;   KNEE ARTHROSCOPY WITH MEDIAL MENISECTOMY Right 08/14/2015   Procedure: KNEE ARTHROSCOPY WITH MEDIAL AND LATERAL MENISECTOMY;  Surgeon: Marcene Corning, MD;  Location: [facility];  Service: Orthopedics;  Laterality: Right;    Current Outpatient Medications  Medication Sig Dispense Refill   chlorthalidone (HYGROTON) 25 MG tablet Take 1 tablet (25 mg total) by mouth daily. 30 tablet 3   ciprofloxacin (CIPRO) 500 MG tablet Take 500 mg by mouth 2 (two) times daily.     losartan (COZAAR) 100 MG tablet Take 100 mg by mouth daily.     metFORMIN (GLUCOPHAGE) 1000 MG tablet Take 1,000 mg by mouth 2 (two) times daily.     metroNIDAZOLE (FLAGYL) 500 MG tablet Take 500 mg by mouth 2 (two) times daily.     No current facility-administered medications for this visit.  Allergies as of 01/27/2023   (No Known Allergies)    Family History  Problem Relation Age of Onset   Diabetes Mother    Cancer Brother    Cancer Brother     Social History   Socioeconomic History   Marital status: Single    Spouse name: Not on file   Number of children: Not on file   Years of education: Not on file   Highest education level: Not on file  Occupational History   Not on file  Tobacco Use   Smoking status: Never    Passive exposure: Never   Smokeless tobacco: Never  Vaping Use    Vaping Use: Never used  Substance and Sexual Activity   Alcohol use: Yes    Comment: occasionally   Drug use: No   Sexual activity: Yes    Birth control/protection: None  Other Topics Concern   Not on file  Social History Narrative   Not on file   Social Determinants of Health   Financial Resource Strain: Not on file  Food Insecurity: Not on file  Transportation Needs: Not on file  Physical Activity: Not on file  Stress: Not on file  Social Connections: Not on file   Review of systems General: negative for malaise, night sweats, fever, chills, weight loss  Neck: Negative for lumps, goiter, pain and significant neck swelling Resp: Negative for cough, wheezing, dyspnea at rest CV: Negative for chest pain, leg swelling, palpitations, orthopnea GI: denies melena, hematochezia, nausea, vomiting, constipation, dysphagia, odyonophagia, early satiety or unintentional weight loss. +diarrhea  MSK: Negative for joint pain or swelling, back pain, and muscle pain. Derm: Negative for itching or rash Psych: Denies depression, anxiety, memory loss, confusion. No homicidal or suicidal ideation.  Heme: Negative for prolonged bleeding, bruising easily, and swollen nodes. Endocrine: Negative for cold or heat intolerance, polyuria, polydipsia and goiter. Neuro: negative for tremor, gait imbalance, syncope and seizures. The remainder of the review of systems is noncontributory.  Physical Exam: BP 125/76 (BP Location: Left Arm, Patient Position: Sitting, Cuff Size: Large)   Pulse (!) 122   Temp 97.8 F (36.6 C) (Oral)   Ht 5\' 10"  (1.778 m)   Wt 198 lb 11.2 oz (90.1 kg)   BMI 28.51 kg/m  General:   Alert and oriented. No distress noted. Pleasant and cooperative.  Head:  Normocephalic and atraumatic. Eyes:  Conjuctiva clear without scleral icterus. Mouth:  Oral mucosa pink and moist. Good dentition. No lesions. Heart: Normal rate and rhythm, s1 and s2 heart sounds present.  Lungs: Clear  lung sounds in all lobes. Respirations equal and unlabored. Abdomen:  +BS, soft, non-tender and non-distended. No rebound or guarding. No HSM or masses noted. Derm: No palmar erythema or jaundice Msk:  Symmetrical without gross deformities. Normal posture. Extremities:  Without edema. Neurologic:  Alert and  oriented x4 Psych:  Alert and cooperative. Normal mood and affect.  Invalid input(s): "6 MONTHS"   ASSESSMENT: Mason Patterson is a 62 y.o. male presenting today with dysphagia and to get colonoscopy scheduled.  Dysphagia: notes foods feeling stuck in mid chest, occasionally liquids/pills also pass down slow. Recommend proceeding with EGD +/- dilation as I cannot rule out esophageal stricture, stenosis, ring, web.   Patient previously scheduled for Colonoscopy in September 2023 due to family history and based on previous colonoscopy findings. Notably being empirically treated by PCP for suspected diverticulitis currently which he feels is improving. Recommend proceeding with Colonoscopy, will wait 6-8 weeks after  suspected acute diverticulitis to schedule this.   Indications, risks and benefits of procedure discussed in detail with patient. Patient verbalized understanding and is in agreement to proceed with EGD/Colonoscopy.    PLAN:  Schedule EGD/Colonoscopy (02/24/23 or after ) ASA II  Continue antibiotic course til completion  3. Chewing precautions   All questions were answered, patient verbalized understanding and is in agreement with plan as outlined above.   Follow Up: 3 months   Kyzen Horn L. Jeanmarie Hubert, MSN, APRN, AGNP-C Adult-Gerontology Nurse Practitioner [facility]  I have reviewed the note and agree with the APP's assessment as described in this progress note  ADDENDUM: I spoke to the patient prior to his EGD and colonoscopy, he reported he does not have any family members with history of colon cancer.  Katrinka Blazing, MD Gastroenterology and  Hepatology [facility] Gastroenterology

## 2023-01-31 DIAGNOSIS — R1310 Dysphagia, unspecified: Secondary | ICD-10-CM | POA: Insufficient documentation

## 2023-02-14 ENCOUNTER — Ambulatory Visit (HOSPITAL_COMMUNITY): Payer: 59

## 2023-03-01 ENCOUNTER — Encounter (INDEPENDENT_AMBULATORY_CARE_PROVIDER_SITE_OTHER): Payer: Self-pay | Admitting: Gastroenterology

## 2023-03-01 ENCOUNTER — Telehealth (INDEPENDENT_AMBULATORY_CARE_PROVIDER_SITE_OTHER): Payer: Self-pay | Admitting: Gastroenterology

## 2023-03-01 NOTE — Telephone Encounter (Signed)
Pt contacted and rescheduled.

## 2023-03-01 NOTE — Telephone Encounter (Signed)
Pt sent my chart message stating he did not have a chance to get his blood work done prior to TCS/EGD that was scheduled.   Contacted pt and rescheduled procedures to 03/30/23. Updated instructions sent via my chart.

## 2023-03-02 ENCOUNTER — Ambulatory Visit (HOSPITAL_COMMUNITY): Admission: RE | Admit: 2023-03-02 | Payer: 59 | Source: Home / Self Care | Admitting: Gastroenterology

## 2023-03-02 ENCOUNTER — Encounter (HOSPITAL_COMMUNITY): Admission: RE | Payer: Self-pay | Source: Home / Self Care

## 2023-03-02 SURGERY — COLONOSCOPY WITH PROPOFOL
Anesthesia: Monitor Anesthesia Care

## 2023-03-24 ENCOUNTER — Other Ambulatory Visit (HOSPITAL_COMMUNITY)
Admission: RE | Admit: 2023-03-24 | Discharge: 2023-03-24 | Disposition: A | Discharge status: Home / Self Care | Payer: 59 | Source: Ambulatory Visit | Attending: Gastroenterology | Admitting: Gastroenterology

## 2023-03-24 DIAGNOSIS — Z79899 Other long term (current) drug therapy: Secondary | ICD-10-CM | POA: Insufficient documentation

## 2023-03-24 DIAGNOSIS — Z800 Family history of malignant neoplasm of digestive organs: Secondary | ICD-10-CM | POA: Diagnosis not present

## 2023-03-24 LAB — BASIC METABOLIC PANEL
Anion gap: 9 (ref 5–15)
BUN: 25 mg/dL — ABNORMAL HIGH (ref 6–20)
CO2: 25 mmol/L (ref 22–32)
Calcium: 9.3 mg/dL (ref 8.9–10.3)
Chloride: 101 mmol/L (ref 98–111)
Creatinine, Ser: 0.97 mg/dL (ref 0.61–1.24)
GFR, Estimated: >60 mL/min (ref >60)
Glucose, Bld: 129 mg/dL — ABNORMAL HIGH (ref 70–99)
Potassium: 4.2 mmol/L (ref 3.5–5.1)
Sodium: 135 mmol/L (ref 135–145)

## 2023-03-30 ENCOUNTER — Encounter (HOSPITAL_COMMUNITY): Payer: Self-pay | Admitting: Gastroenterology

## 2023-03-30 ENCOUNTER — Ambulatory Visit (HOSPITAL_BASED_OUTPATIENT_CLINIC_OR_DEPARTMENT_OTHER): Payer: 59 | Admitting: Certified Registered"

## 2023-03-30 ENCOUNTER — Other Ambulatory Visit: Payer: Self-pay

## 2023-03-30 ENCOUNTER — Encounter (HOSPITAL_COMMUNITY)
Admission: RE | Disposition: A | Discharge status: Home / Self Care | Payer: Self-pay | Source: Ambulatory Visit | Attending: Gastroenterology

## 2023-03-30 ENCOUNTER — Ambulatory Visit (HOSPITAL_COMMUNITY): Payer: 59 | Admitting: Certified Registered"

## 2023-03-30 ENCOUNTER — Ambulatory Visit (HOSPITAL_COMMUNITY)
Admission: RE | Admit: 2023-03-30 | Discharge: 2023-03-30 | Disposition: A | Discharge status: Home / Self Care | Payer: 59 | Source: Ambulatory Visit | Attending: Gastroenterology | Admitting: Gastroenterology

## 2023-03-30 DIAGNOSIS — Z860100 Personal history of colon polyps, unspecified: Secondary | ICD-10-CM

## 2023-03-30 DIAGNOSIS — Z1211 Encounter for screening for malignant neoplasm of colon: Secondary | ICD-10-CM | POA: Diagnosis not present

## 2023-03-30 DIAGNOSIS — K449 Diaphragmatic hernia without obstruction or gangrene: Secondary | ICD-10-CM

## 2023-03-30 DIAGNOSIS — K2950 Unspecified chronic gastritis without bleeding: Secondary | ICD-10-CM | POA: Insufficient documentation

## 2023-03-30 DIAGNOSIS — K2980 Duodenitis without bleeding: Secondary | ICD-10-CM | POA: Diagnosis not present

## 2023-03-30 DIAGNOSIS — K649 Unspecified hemorrhoids: Secondary | ICD-10-CM | POA: Diagnosis not present

## 2023-03-30 DIAGNOSIS — E785 Hyperlipidemia, unspecified: Secondary | ICD-10-CM | POA: Insufficient documentation

## 2023-03-30 DIAGNOSIS — D123 Benign neoplasm of transverse colon: Secondary | ICD-10-CM | POA: Insufficient documentation

## 2023-03-30 DIAGNOSIS — R1310 Dysphagia, unspecified: Secondary | ICD-10-CM | POA: Insufficient documentation

## 2023-03-30 DIAGNOSIS — K648 Other hemorrhoids: Secondary | ICD-10-CM | POA: Insufficient documentation

## 2023-03-30 DIAGNOSIS — K259 Gastric ulcer, unspecified as acute or chronic, without hemorrhage or perforation: Secondary | ICD-10-CM | POA: Insufficient documentation

## 2023-03-30 DIAGNOSIS — K5790 Diverticulosis of intestine, part unspecified, without perforation or abscess without bleeding: Secondary | ICD-10-CM

## 2023-03-30 DIAGNOSIS — K635 Polyp of colon: Secondary | ICD-10-CM | POA: Diagnosis not present

## 2023-03-30 DIAGNOSIS — Z7984 Long term (current) use of oral hypoglycemic drugs: Secondary | ICD-10-CM | POA: Insufficient documentation

## 2023-03-30 DIAGNOSIS — K5730 Diverticulosis of large intestine without perforation or abscess without bleeding: Secondary | ICD-10-CM | POA: Diagnosis not present

## 2023-03-30 DIAGNOSIS — D122 Benign neoplasm of ascending colon: Secondary | ICD-10-CM | POA: Diagnosis not present

## 2023-03-30 DIAGNOSIS — I10 Essential (primary) hypertension: Secondary | ICD-10-CM | POA: Insufficient documentation

## 2023-03-30 DIAGNOSIS — B9681 Helicobacter pylori [H. pylori] as the cause of diseases classified elsewhere: Secondary | ICD-10-CM | POA: Insufficient documentation

## 2023-03-30 DIAGNOSIS — Z79899 Other long term (current) drug therapy: Secondary | ICD-10-CM | POA: Diagnosis not present

## 2023-03-30 HISTORY — PX: BIOPSY: SHX5522

## 2023-03-30 HISTORY — PX: SAVORY DILATION: SHX5439

## 2023-03-30 HISTORY — PX: ESOPHAGOGASTRODUODENOSCOPY (EGD) WITH PROPOFOL: SHX5813

## 2023-03-30 HISTORY — PX: POLYPECTOMY: SHX5525

## 2023-03-30 HISTORY — PX: COLONOSCOPY WITH PROPOFOL: SHX5780

## 2023-03-30 LAB — HM COLONOSCOPY

## 2023-03-30 LAB — GLUCOSE, CAPILLARY: Glucose-Capillary: 125 mg/dL — ABNORMAL HIGH (ref 70–99)

## 2023-03-30 SURGERY — COLONOSCOPY WITH PROPOFOL
Anesthesia: General

## 2023-03-30 MED ORDER — PROPOFOL 10 MG/ML IV BOLUS
INTRAVENOUS | Status: DC | PRN
  Administered 2023-03-30: 80 mg via INTRAVENOUS
  Administered 2023-03-30: 50 mg via INTRAVENOUS
  Administered 2023-03-30: 30 mg via INTRAVENOUS

## 2023-03-30 MED ORDER — PROPOFOL 10 MG/ML IV BOLUS
INTRAVENOUS | Status: AC
  Filled 2023-03-30: qty 20

## 2023-03-30 MED ORDER — LACTATED RINGERS IV SOLN
INTRAVENOUS | Status: DC
  Administered 2023-03-30: 1000 mL via INTRAVENOUS

## 2023-03-30 MED ORDER — PHENYLEPHRINE HCL (PRESSORS) 10 MG/ML IV SOLN
INTRAVENOUS | Status: DC | PRN
  Administered 2023-03-30: 50 ug via INTRAVENOUS
  Administered 2023-03-30 (×2): 100 ug via INTRAVENOUS

## 2023-03-30 MED ORDER — PROPOFOL 500 MG/50ML IV EMUL
INTRAVENOUS | Status: DC | PRN
  Administered 2023-03-30: 125 ug/kg/min via INTRAVENOUS

## 2023-03-30 MED ORDER — PHENYLEPHRINE 80 MCG/ML (10ML) SYRINGE FOR IV PUSH (FOR BLOOD PRESSURE SUPPORT)
PREFILLED_SYRINGE | INTRAVENOUS | Status: AC
  Filled 2023-03-30: qty 10

## 2023-03-30 MED ORDER — OMEPRAZOLE 40 MG PO CPDR: 40.0000 mg | DELAYED_RELEASE_CAPSULE | Freq: Every day | ORAL | 3 refills | Status: DC

## 2023-03-30 NOTE — Op Note (Signed)
[facility] Patient Name: Mason Patterson Procedure Date: 03/30/2023 1:11 PM MRN: [id number] Date of Birth: [birth year] Attending MD: Katrinka Blazing , , [id number] CSN: [id number] Age: 62 Admit Type: Outpatient Procedure:                Colonoscopy Indications:              Surveillance: History of adenomatous polyps,                            inadequate prep on last exam (<23yr) Providers:                Katrinka Blazing, Sheran Fava, Lennice Sites                            Technician, Technician Referring MD:              Medicines:                Monitored Anesthesia Care Complications:            No immediate complications. Estimated Blood Loss:     Estimated blood loss: none. Procedure:                Pre-Anesthesia Assessment:                           - Prior to the procedure, a History and Physical                            was performed, and patient medications, allergies                            and sensitivities were reviewed. The patient's                            tolerance of previous anesthesia was reviewed.                           - The risks and benefits of the procedure and the                            sedation options and risks were discussed with the                            patient. All questions were answered and informed                            consent was obtained.                           - ASA Grade Assessment: II - A patient with mild                            systemic disease.                           After obtaining informed consent, the colonoscope  was passed under direct vision. Throughout the                            procedure, the patient's blood pressure, pulse, and                            oxygen saturations were monitored continuously. The                            [id number]-HQ190L ([id number]) scope was introduced through                            the anus and advanced to the the cecum,  identified                            by appendiceal orifice and ileocecal valve. The                            colonoscopy was performed without difficulty. The                            patient tolerated the procedure well. The quality                            of the bowel preparation was adequate. Scope In: 1:51:01 PM Scope Out: 2:07:45 PM Scope Withdrawal Time: 0 hours 13 minutes 32 seconds  Total Procedure Duration: 0 hours 16 minutes 44 seconds  Findings:      The perianal and digital rectal examinations were normal.      A 1 mm polyp was found in the ascending colon. The polyp was sessile.       The polyp was removed with a cold biopsy forceps. Resection and       retrieval were complete.      A 2 mm polyp was found in the transverse colon. The polyp was sessile.       The polyp was removed with a cold snare. Resection and retrieval were       complete.      Scattered medium-mouthed diverticula were found in the sigmoid colon and       descending colon.      Non-bleeding internal hemorrhoids were found during retroflexion. The       hemorrhoids were small. Impression:               - One 1 mm polyp in the ascending colon, removed                            with a cold biopsy forceps. Resected and retrieved.                           - One 2 mm polyp in the transverse colon, removed                            with a cold snare. Resected and retrieved.                           -  Diverticulosis in the sigmoid colon and in the                            descending colon.                           - Non-bleeding internal hemorrhoids. Moderate Sedation:      Per Anesthesia Care Recommendation:           - Discharge patient to home (ambulatory).                           - Resume previous diet.                           - Await pathology results.                           - Repeat colonoscopy for surveillance based on                            pathology results. Procedure  Code(s):        --- Professional ---                           [phone], Colonoscopy, flexible; with removal of                            tumor(s), polyp(s), or other lesion(s) by snare                            technique                           45380, 59, Colonoscopy, flexible; with biopsy,                            single or multiple Diagnosis Code(s):        --- Professional ---                           Z86.010, Personal history of colonic polyps                           D12.2, Benign neoplasm of ascending colon                           D12.3, Benign neoplasm of transverse colon (hepatic                            flexure or splenic flexure)                           K64.8, Other hemorrhoids                           K57.30, Diverticulosis of large intestine without  perforation or abscess without bleeding CPT copyright 2022 American Medical Association. All rights reserved. The codes documented in this report are preliminary and upon coder review may  be revised to meet current compliance requirements. Katrinka Blazing, MD Katrinka Blazing,  03/30/2023 2:17:20 PM This report has been signed electronically. Number of Addenda: 0

## 2023-03-30 NOTE — Transfer of Care (Signed)
Immediate Anesthesia Transfer of Care Note  Patient: Mason Patterson  Procedure(s) Performed: COLONOSCOPY WITH PROPOFOL ESOPHAGOGASTRODUODENOSCOPY (EGD) WITH PROPOFOL BIOPSY SAVORY DILATION POLYPECTOMY  Patient Location: PACU  Anesthesia Type:General  Level of Consciousness: drowsy and patient cooperative  Airway & Oxygen Therapy: Patient Spontanous Breathing and Patient connected to nasal cannula oxygen  Post-op Assessment: Report given to RN and Post -op Vital signs reviewed and stable  Post vital signs: Reviewed and stable  Last Vitals:  Vitals Value Taken Time  BP 112/73 03/30/23 1414  Temp 36.6 C 03/30/23 1412  Pulse 97 03/30/23 1414  Resp 22 03/30/23 1414  SpO2 96 % 03/30/23 1414    Last Pain:  Vitals:   03/30/23 1414  TempSrc:   PainSc: 0-No pain      Patients Stated Pain Goal: 5 (03/30/23 1222)  Complications: No notable events documented.

## 2023-03-30 NOTE — H&P (Signed)
Mason Patterson is an 62 y.o. male.   Chief Complaint: Dysphagia and history of colonic polyps HPI: 62 year old male with past medical history of hyperlipidemia, hypertension, coming for dysphagia and history of colonic polyps.  Has presented intermittent episodes of dysphagia when eating meat.  This is not on a regular basis.  The patient denies having any heartburn nausea, vomiting, fever, chills, hematochezia, melena, hematemesis, abdominal distention, abdominal pain, diarrhea, jaundice, pruritus or weight loss.  Notably, he denies having any family members with history of colon cancer.   Past Medical History:  Diagnosis Date   Acute medial meniscal injury of right knee    Arthritis    Hyperlipidemia    Hypertension     Past Surgical History:  Procedure Laterality Date   CHONDROPLASTY Right 08/14/2015   Procedure: CHONDROPLASTY;  Surgeon: Marcene Corning, MD;  Location: [facility];  Service: Orthopedics;  Laterality: Right;   KNEE ARTHROSCOPY WITH MEDIAL MENISECTOMY Right 08/14/2015   Procedure: KNEE ARTHROSCOPY WITH MEDIAL AND LATERAL MENISECTOMY;  Surgeon: Marcene Corning, MD;  Location: [facility];  Service: Orthopedics;  Laterality: Right;   KNEE SURGERY Left 2017    Family History  Problem Relation Age of Onset   Diabetes Mother    Cancer Brother    Cancer Brother    Social History:  reports that he has never smoked. He has never been exposed to tobacco smoke. He has never used smokeless tobacco. He reports current alcohol use. He reports that he does not use drugs.  Allergies: No Known Allergies  Medications Prior to Admission  Medication Sig Dispense Refill   chlorthalidone (HYGROTON) 25 MG tablet Take 1 tablet (25 mg total) by mouth daily. (Patient taking differently: Take 25 mg by mouth every evening.) 30 tablet 3   losartan (COZAAR) 100 MG tablet Take 100 mg by mouth every evening.     metFORMIN (GLUCOPHAGE) 1000 MG tablet Take 1,000  mg by mouth 2 (two) times daily.     naproxen sodium (ALEVE) 220 MG tablet Take 660 mg by mouth daily as needed (pain.).     sildenafil (REVATIO) 20 MG tablet Take 20 mg by mouth daily as needed (erectile dysfunction).      Results for orders placed or performed during the hospital encounter of 03/30/23 (from the past 48 hour(s))  Glucose, capillary     Status: Abnormal   Collection Time: 03/30/23 12:26 PM  Result Value Ref Range   Glucose-Capillary 125 (H) 70 - 99 mg/dL    Comment: Glucose reference range applies only to samples taken after fasting for at least 8 hours.   No results found.  Review of Systems  HENT:  Positive for trouble swallowing.   All other systems reviewed and are negative.   Blood pressure 137/85, pulse 97, temperature 98.5 F (36.9 C), temperature source Oral, resp. rate 14, height 5\' 10"  (1.778 m), weight 87.5 kg, SpO2 97 %. Physical Exam  GENERAL: The patient is AO x3, in no acute distress. HEENT: Head is normocephalic and atraumatic. EOMI are intact. Mouth is well hydrated and without lesions. NECK: Supple. No masses LUNGS: Clear to auscultation. No presence of rhonchi/wheezing/rales. Adequate chest expansion HEART: RRR, normal s1 and s2. ABDOMEN: Soft, nontender, no guarding, no peritoneal signs, and nondistended. BS +. No masses. EXTREMITIES: Without any cyanosis, clubbing, rash, lesions or edema. NEUROLOGIC: AOx3, no focal motor deficit. SKIN: no jaundice, no rashes  Assessment/Plan 62 year old male with past medical history of hyperlipidemia, hypertension, coming for  dysphagia and history of colonic polyps.  Will proceed with EGD and colonoscopy. Dolores Frame, MD 03/30/2023, 12:55 PM

## 2023-03-30 NOTE — Discharge Instructions (Addendum)
You are being discharged to home.  Resume your previous diet.  We are waiting for your pathology results.  Take Prilosec (omeprazole) 40 mg by mouth once a day.  Do not take any ibuprofen (including Advil, Motrin or Nuprin), naproxen, or other non-steroidal anti-inflammatory drugs.  Your physician has recommended a repeat upper endoscopy in three months for surveillance.  Your physician has recommended a repeat colonoscopy for surveillance based on pathology results.

## 2023-03-30 NOTE — Op Note (Signed)
[facility] Patient Name: Mason Patterson Procedure Date: 03/30/2023 1:14 PM MRN: [id number] Date of Birth: 01-28-1961 Attending MD: Katrinka Blazing , , [id number] CSN: [id number] Age: 62 Admit Type: Outpatient Procedure:                Upper GI endoscopy Indications:              Dysphagia Providers:                Katrinka Blazing, Sheran Fava, Lennice Sites                            Technician, Technician Referring MD:              Medicines:                Monitored Anesthesia Care Complications:            No immediate complications. Estimated Blood Loss:     Estimated blood loss: none. Procedure:                Pre-Anesthesia Assessment:                           - Prior to the procedure, a History and Physical                            was performed, and patient medications, allergies                            and sensitivities were reviewed. The patient's                            tolerance of previous anesthesia was reviewed.                           - The risks and benefits of the procedure and the                            sedation options and risks were discussed with the                            patient. All questions were answered and informed                            consent was obtained.                           - ASA Grade Assessment: II - A patient with mild                            systemic disease.                           After obtaining informed consent, the endoscope was                            passed under direct vision. Throughout the  procedure, the patient's blood pressure, pulse, and                            oxygen saturations were monitored continuously. The                            [id number]-H190 ([id number]) scope was introduced through the                            mouth, and advanced to the second part of duodenum.                            The upper GI endoscopy was accomplished without                             difficulty. The patient tolerated the procedure                            well. Scope In: 1:30:31 PM Scope Out: 1:44:14 PM Total Procedure Duration: 0 hours 13 minutes 43 seconds  Findings:      No endoscopic abnormality was evident in the esophagus to explain the       patient's complaint of dysphagia. It was decided, however, to proceed       with dilation of the entire esophagus. A guidewire was placed and the       scope was withdrawn. Dilation was performed with a Savary dilator with       no resistance at 20 mm. The dilation site was examined following       endoscope reinsertion and showed no change. Biopsies were obtained from       the proximal and distal esophagus with cold forceps for histology of       eosinophilic esophagitis.      A 1 cm sliding hiatal hernia was found.      Two non-bleeding superficial gastric ulcers with a clean ulcer base       (Forrest Class III) were found in the gastric antrum. The largest lesion       was 8 mm in largest dimension. Biopsies were taken with a cold forceps       for Helicobacter pylori testing.      Patchy inflammation characterized by congestion (edema), erosions and       erythema was found in the first portion of the duodenum. Impression:               - No endoscopic esophageal abnormality to explain                            patient's dysphagia. Esophagus dilated. .                           - 1 cm sliding hiatal hernia.                           - Non-bleeding gastric ulcers with a clean ulcer  base (Forrest Class III). Biopsied.                           - Duodenitis.                           - Biopsies were taken with a cold forceps for                            evaluation of eosinophilic esophagitis. Moderate Sedation:      Per Anesthesia Care Recommendation:           - Discharge patient to home (ambulatory).                           - Resume previous diet.                            - Await pathology results.                           - Use Prilosec (omeprazole) 40 mg PO daily.                           - No ibuprofen, naproxen, or other non-steroidal                            anti-inflammatory drugs.                           - Repeat upper endoscopy in 3 months for                            surveillance. Procedure Code(s):        --- Professional ---                           [phone], Esophagogastroduodenoscopy, flexible,                            transoral; with insertion of guide wire followed by                            passage of dilator(s) through esophagus over guide                            wire                           43239, 59, Esophagogastroduodenoscopy, flexible,                            transoral; with biopsy, single or multiple Diagnosis Code(s):        --- Professional ---                           R13.10, Dysphagia, unspecified  K44.9, Diaphragmatic hernia without obstruction or                            gangrene                           K25.9, Gastric ulcer, unspecified as acute or                            chronic, without hemorrhage or perforation                           K29.80, Duodenitis without bleeding CPT copyright 2022 American Medical Association. All rights reserved. The codes documented in this report are preliminary and upon coder review may  be revised to meet current compliance requirements. Katrinka Blazing, MD Katrinka Blazing,  03/30/2023 1:50:25 PM This report has been signed electronically. Number of Addenda: 0

## 2023-03-30 NOTE — Anesthesia Preprocedure Evaluation (Signed)
Anesthesia Evaluation  Patient identified by MRN, date of birth, ID band Patient awake    Reviewed: Allergy & Precautions, H&P , NPO status , Patient's Chart, lab work & pertinent test results, reviewed documented beta blocker date and time   Airway Mallampati: II  TM Distance: >3 FB Neck ROM: full    Dental no notable dental hx.    Pulmonary neg pulmonary ROS   Pulmonary exam normal breath sounds clear to auscultation       Cardiovascular Exercise Tolerance: Good hypertension, negative cardio ROS  Rhythm:regular Rate:Normal     Neuro/Psych negative neurological ROS  negative psych ROS   GI/Hepatic negative GI ROS, Neg liver ROS,GERD  ,,  Endo/Other  negative endocrine ROS    Renal/GU negative Renal ROS  negative genitourinary   Musculoskeletal   Abdominal   Peds  Hematology negative hematology ROS (+)   Anesthesia Other Findings   Reproductive/Obstetrics negative OB ROS                             Anesthesia Physical Anesthesia Plan  ASA: 2  Anesthesia Plan: General   Post-op Pain Management:    Induction:   PONV Risk Score and Plan: Propofol infusion  Airway Management Planned:   Additional Equipment:   Intra-op Plan:   Post-operative Plan:   Informed Consent: I have reviewed the patients History and Physical, chart, labs and discussed the procedure including the risks, benefits and alternatives for the proposed anesthesia with the patient or authorized representative who has indicated his/her understanding and acceptance.     Dental Advisory Given  Plan Discussed with: CRNA  Anesthesia Plan Comments:        Anesthesia Quick Evaluation  

## 2023-03-31 ENCOUNTER — Encounter (INDEPENDENT_AMBULATORY_CARE_PROVIDER_SITE_OTHER): Payer: Self-pay | Admitting: *Deleted

## 2023-03-31 LAB — SURGICAL PATHOLOGY

## 2023-04-01 ENCOUNTER — Other Ambulatory Visit: Payer: Self-pay | Admitting: Gastroenterology

## 2023-04-01 DIAGNOSIS — B9681 Helicobacter pylori [H. pylori] as the cause of diseases classified elsewhere: Secondary | ICD-10-CM

## 2023-04-01 MED ORDER — TETRACYCLINE HCL 500 MG PO CAPS
500.0000 mg | ORAL_CAPSULE | Freq: Four times a day (QID) | ORAL | 0 refills | Status: AC
Start: 2023-04-01 — End: 2023-04-15

## 2023-04-01 MED ORDER — BISMUTH 262 MG PO CHEW
2.0000 | CHEWABLE_TABLET | Freq: Four times a day (QID) | ORAL | 0 refills | Status: DC
Start: 2023-04-01 — End: 2023-05-23

## 2023-04-01 MED ORDER — METRONIDAZOLE 500 MG PO TABS
500.0000 mg | ORAL_TABLET | Freq: Three times a day (TID) | ORAL | 0 refills | Status: AC
Start: 2023-04-01 — End: 2023-04-15

## 2023-04-01 NOTE — Anesthesia Postprocedure Evaluation (Signed)
Anesthesia Post Note  Patient: Mason Patterson  Procedure(s) Performed: COLONOSCOPY WITH PROPOFOL ESOPHAGOGASTRODUODENOSCOPY (EGD) WITH PROPOFOL BIOPSY SAVORY DILATION POLYPECTOMY  Patient location during evaluation: Phase II Anesthesia Type: General Level of consciousness: awake Pain management: pain level controlled Vital Signs Assessment: post-procedure vital signs reviewed and stable Respiratory status: spontaneous breathing and respiratory function stable Cardiovascular status: blood pressure returned to baseline and stable Postop Assessment: no headache and no apparent nausea or vomiting Anesthetic complications: no Comments: Late entry   No notable events documented.   Last Vitals:  Vitals:   03/30/23 1412 03/30/23 1414  BP: (!) 87/76 112/73  Pulse: 92 97  Resp: 15 (!) 22  Temp: 36.6 C   SpO2: 95% 96%    Last Pain:  Vitals:   03/30/23 1414  TempSrc:   PainSc: 0-No pain                 Windell Norfolk

## 2023-04-05 ENCOUNTER — Encounter (HOSPITAL_COMMUNITY): Payer: Self-pay | Admitting: Gastroenterology

## 2023-04-28 DIAGNOSIS — Z7984 Long term (current) use of oral hypoglycemic drugs: Secondary | ICD-10-CM | POA: Diagnosis not present

## 2023-04-28 DIAGNOSIS — N529 Male erectile dysfunction, unspecified: Secondary | ICD-10-CM | POA: Diagnosis not present

## 2023-04-28 DIAGNOSIS — K219 Gastro-esophageal reflux disease without esophagitis: Secondary | ICD-10-CM | POA: Diagnosis not present

## 2023-04-28 DIAGNOSIS — Z811 Family history of alcohol abuse and dependence: Secondary | ICD-10-CM | POA: Diagnosis not present

## 2023-04-28 DIAGNOSIS — Z833 Family history of diabetes mellitus: Secondary | ICD-10-CM | POA: Diagnosis not present

## 2023-04-28 DIAGNOSIS — I10 Essential (primary) hypertension: Secondary | ICD-10-CM | POA: Diagnosis not present

## 2023-04-28 DIAGNOSIS — E119 Type 2 diabetes mellitus without complications: Secondary | ICD-10-CM | POA: Diagnosis not present

## 2023-04-28 DIAGNOSIS — Z8249 Family history of ischemic heart disease and other diseases of the circulatory system: Secondary | ICD-10-CM | POA: Diagnosis not present

## 2023-04-28 DIAGNOSIS — Z809 Family history of malignant neoplasm, unspecified: Secondary | ICD-10-CM | POA: Diagnosis not present

## 2023-05-03 ENCOUNTER — Other Ambulatory Visit: Payer: Self-pay | Admitting: Urology

## 2023-05-05 ENCOUNTER — Ambulatory Visit (INDEPENDENT_AMBULATORY_CARE_PROVIDER_SITE_OTHER): Payer: 59 | Admitting: Gastroenterology

## 2023-05-23 ENCOUNTER — Encounter (INDEPENDENT_AMBULATORY_CARE_PROVIDER_SITE_OTHER): Payer: Self-pay | Admitting: Gastroenterology

## 2023-05-23 ENCOUNTER — Ambulatory Visit (INDEPENDENT_AMBULATORY_CARE_PROVIDER_SITE_OTHER): Payer: 59 | Admitting: Gastroenterology

## 2023-05-23 VITALS — BP 127/73 | HR 114 | Temp 98.3°F | Ht 70.0 in | Wt 200.3 lb

## 2023-05-23 DIAGNOSIS — K2970 Gastritis, unspecified, without bleeding: Secondary | ICD-10-CM | POA: Diagnosis not present

## 2023-05-23 DIAGNOSIS — R350 Frequency of micturition: Secondary | ICD-10-CM | POA: Diagnosis not present

## 2023-05-23 DIAGNOSIS — B9681 Helicobacter pylori [H. pylori] as the cause of diseases classified elsewhere: Secondary | ICD-10-CM

## 2023-05-23 DIAGNOSIS — E7849 Other hyperlipidemia: Secondary | ICD-10-CM | POA: Diagnosis not present

## 2023-05-23 DIAGNOSIS — Z6830 Body mass index (BMI) 30.0-30.9, adult: Secondary | ICD-10-CM | POA: Diagnosis not present

## 2023-05-23 DIAGNOSIS — R946 Abnormal results of thyroid function studies: Secondary | ICD-10-CM | POA: Diagnosis not present

## 2023-05-23 DIAGNOSIS — I10 Essential (primary) hypertension: Secondary | ICD-10-CM | POA: Diagnosis not present

## 2023-05-23 DIAGNOSIS — E1165 Type 2 diabetes mellitus with hyperglycemia: Secondary | ICD-10-CM | POA: Diagnosis not present

## 2023-05-23 DIAGNOSIS — E6609 Other obesity due to excess calories: Secondary | ICD-10-CM | POA: Diagnosis not present

## 2023-05-23 DIAGNOSIS — E782 Mixed hyperlipidemia: Secondary | ICD-10-CM | POA: Diagnosis not present

## 2023-05-23 DIAGNOSIS — Z91199 Patient's noncompliance with other medical treatment and regimen due to unspecified reason: Secondary | ICD-10-CM | POA: Diagnosis not present

## 2023-05-23 LAB — HEMOGLOBIN A1C: Hemoglobin A1C: 8

## 2023-05-23 NOTE — Patient Instructions (Addendum)
As you have not been taking omeprazole, please proceed to the lab to perform H pylori breath testing to help determine if the treatment we prescribed previously healed this infection, once you complete the breat test, please start back taking omeprazole 40mg  once daily We will schedule repeat Upper endoscopy to evaluate the ulcers in your stomach again in August  Follow up TBD after upper endoscopy

## 2023-05-23 NOTE — Progress Notes (Addendum)
Referring Provider: Assunta Found, MD Primary Care Physician:  Assunta Found, MD Primary GI Physician: Levon Hedger   Chief Complaint  Patient presents with   Dysphagia    Follow up on dysphagia. Reports takes omeprazole as needed. Stomach is making some gurgling noises.    HPI:   Mason Patterson is a 62 y.o. male with past medical history of  hyperlipidemia, hypertension, GERD, obesity   Patient presenting today for for follow up of dysphagia/H pylori/gastric ulcers.  Last seen march 2024, at that time feeling food sticking in mid chest as well as some nausea, gurgling and stomach bloating. Having 3-5 BMs per day. At that time Recently treated for diverticulitis with flagyl and cipro prescribed by outside provider   Recommended to schedule EGD/Colonoscopy   EGD with gastric ulcers and H pylori, treated with quadruple bismuth therapy.   Present: Patient states he is feeling okay. Still has some bloating with eating at times. No nausea, vomiting or abdominal pain. He notes a lot of gurgling still in his belly. Dysphagia has resolved since EGD. No rectal bleeding or melena. He denies GERD symptoms. States he never had any other UGI symptoms besides dysphagia and bloating, is unsure how to know if the medications we prescribed took care of the H pylori infection he had.   Last Colonoscopy:03/30/23 - One 1 mm polyp in the ascending colon, removed                            with a cold biopsy forceps. Resected and retrieved.                           - One 2 mm polyp in the transverse colon, removed                            with a cold snare. Resected and retrieved.                           - Diverticulosis in the sigmoid colon and in the                            descending colon.                           - Non-bleeding internal hemorrhoids. (2 TAs) Last Endoscopy:03/30/23 - No endoscopic esophageal abnormality to explain                            patient's dysphagia. Esophagus  dilated. .                           - 1 cm sliding hiatal hernia.                           - Non-bleeding gastric ulcers with a clean ulcer                            base (Forrest Class III). Biopsied-H pylori                           -  Duodenitis.                           - Biopsies were taken with a cold forceps for                            evaluation of eosinophilic esophagitis.  Recommendations:  Repeat EGD August Repeat Colonoscopy 5 years   Past Medical History:  Diagnosis Date   Acute medial meniscal injury of right knee    Arthritis    Hyperlipidemia    Hypertension     Past Surgical History:  Procedure Laterality Date   BIOPSY  03/30/2023   Procedure: BIOPSY;  Surgeon: Dolores Frame, MD;  [facility];  Service: Gastroenterology;;   CHONDROPLASTY Right 08/14/2015   Procedure: CHONDROPLASTY;  Surgeon: Marcene Corning, MD;  Location: [facility];  Service: Orthopedics;  Laterality: Right;   COLONOSCOPY WITH PROPOFOL N/A 03/30/2023   Procedure: COLONOSCOPY WITH PROPOFOL;  Surgeon: Dolores Frame, MD;  [facility]: [facility];  Service: Gastroenterology;  Laterality: N/A;  1:00pm asa 1-2   ESOPHAGOGASTRODUODENOSCOPY (EGD) WITH PROPOFOL N/A 03/30/2023   Procedure: ESOPHAGOGASTRODUODENOSCOPY (EGD) WITH PROPOFOL;  Surgeon: Dolores Frame, MD;  [facility];  Service: Gastroenterology;  Laterality: N/A;  1:00pm;asa 1-2   KNEE ARTHROSCOPY WITH MEDIAL MENISECTOMY Right 08/14/2015   Procedure: KNEE ARTHROSCOPY WITH MEDIAL AND LATERAL MENISECTOMY;  Surgeon: Marcene Corning, MD;  Location: [facility];  Service: Orthopedics;  Laterality: Right;   KNEE SURGERY Left 2017   POLYPECTOMY  03/30/2023   Procedure: POLYPECTOMY;  Surgeon: Dolores Frame, MD;  [facility]: [facility];  Service: Gastroenterology;;   Gaspar Bidding DILATION  03/30/2023   Procedure: Gaspar Bidding DILATION;  Surgeon: Marguerita Merles,  Reuel Boom, MD;  Location: [facility];  Service: Gastroenterology;;    Current Outpatient Medications  Medication Sig Dispense Refill   chlorthalidone (HYGROTON) 25 MG tablet Take 1 tablet (25 mg total) by mouth daily. (Patient taking differently: Take 25 mg by mouth every evening.) 30 tablet 3   losartan (COZAAR) 100 MG tablet Take 100 mg by mouth every evening.     omeprazole (PRILOSEC) 40 MG capsule Take 1 capsule (40 mg total) by mouth daily. 90 capsule 3   No current facility-administered medications for this visit.    Allergies as of 05/23/2023   (No Known Allergies)    Family History  Problem Relation Age of Onset   Diabetes Mother    Cancer Brother    Cancer Brother     Social History   Socioeconomic History   Marital status: Single    Spouse name: Not on file   Number of children: Not on file   Years of education: Not on file   Highest education level: Not on file  Occupational History   Not on file  Tobacco Use   Smoking status: Never    Passive exposure: Never   Smokeless tobacco: Never  Vaping Use   Vaping Use: Never used  Substance and Sexual Activity   Alcohol use: Yes    Comment: occasionally   Drug use: No   Sexual activity: Yes    Birth control/protection: None  Other Topics Concern   Not on file  Social History Narrative   Not on file   Social Determinants of Health   Financial Resource Strain: Not on file  Food Insecurity: Not on file  Transportation Needs: Not on file  Physical Activity: Not on file  Stress: Not on file  Social Connections: Not on file    Review of systems General: negative for malaise, night sweats, fever, chills, weight loss Neck: Negative for lumps, goiter, pain and significant neck swelling Resp: Negative for cough, wheezing, dyspnea at rest CV: Negative for chest pain, leg swelling, palpitations, orthopnea GI: denies melena, hematochezia, nausea, vomiting, diarrhea, constipation, dysphagia, odyonophagia, early  satiety or unintentional weight loss. +bloating  MSK: Negative for joint pain or swelling, back pain, and muscle pain. Derm: Negative for itching or rash Psych: Denies depression, anxiety, memory loss, confusion. No homicidal or suicidal ideation.  Heme: Negative for prolonged bleeding, bruising easily, and swollen nodes. Endocrine: Negative for cold or heat intolerance, polyuria, polydipsia and goiter. Neuro: negative for tremor, gait imbalance, syncope and seizures. The remainder of the review of systems is noncontributory.  Physical Exam: BP 127/73 (BP Location: Left Arm, Patient Position: Sitting, Cuff Size: Large)   Pulse (!) 114   Temp 98.3 F (36.8 C) (Oral)   Ht 5\' 10"  (1.778 m)   Wt 200 lb 4.8 oz (90.9 kg)   BMI 28.74 kg/m  General:   Alert and oriented. No distress noted. Pleasant and cooperative.  Head:  Normocephalic and atraumatic. Eyes:  Conjuctiva clear without scleral icterus. Mouth:  Oral mucosa pink and moist. Good dentition. No lesions. Heart: Normal rate and rhythm, s1 and s2 heart sounds present.  Lungs: Clear lung sounds in all lobes. Respirations equal and unlabored. Abdomen:  +BS, soft, non-tender and non-distended. No rebound or guarding. No HSM or masses noted. Derm: No palmar erythema or jaundice Msk:  Symmetrical without gross deformities. Normal posture. Extremities:  Without edema. Neurologic:  Alert and  oriented x4 Psych:  Alert and cooperative. Normal mood and affect.  Invalid input(s): "6 MONTHS"   ASSESSMENT: Mason Patterson is a 62 y.o. male presenting today for follow up of Dysphagia/H pylori/gastric ulcers  Dysphagia: resolved with EGD +dilation.   H pylori/gastric ulcers: noted on biopsy during EGD in May, treated with quadruple bismuth therapy. He notes some bloating on occasion but denies other UGI symptoms. He notably has not been taking his Omeprazole 40mg . I discussed the H pylori diagnosis with him, to include symptoms and repeat  of breath testing atleast 1 month after treatment off of PPI x2 weeks to confirm eradication. As he has not been on his PPI, he can proceed with breath testing today and should resume PPI thereafter. I also discussed repeat EGD for findings of gastric ulcers in May, Will schedule repeat EGD in August for follow up of these.    PLAN:  Repeat EGD in August-ASA II  2. H Pylori breath testing now (pt has been off PPI) 3. Resume Omeprazole 40mg  once daily after H pylori testing   All questions were answered, patient verbalized understanding and is in agreement with plan as outlined above.   Follow Up: TBD after EGD   Fidel Caggiano L. Jeanmarie Hubert, MSN, APRN, AGNP-C Adult-Gerontology Nurse Practitioner [facility]  I have reviewed the note and agree with the APP's assessment as described in this progress note  Katrinka Blazing, MD Gastroenterology and Hepatology [facility] Gastroenterology

## 2023-06-03 ENCOUNTER — Other Ambulatory Visit: Payer: Self-pay | Admitting: Internal Medicine

## 2023-06-16 ENCOUNTER — Telehealth (INDEPENDENT_AMBULATORY_CARE_PROVIDER_SITE_OTHER): Payer: Self-pay | Admitting: Gastroenterology

## 2023-06-16 NOTE — Telephone Encounter (Signed)
Contacted pt to get him scheduled for repeat EGD. Pt asked that I call him back after 4 or tomorrow. Advised pt we have a staff meeting at 3:30 today. Asked if it would be ok I call back tomorrow morning. Pt advised that would be ok.

## 2023-06-22 ENCOUNTER — Ambulatory Visit: Payer: 59 | Admitting: Internal Medicine

## 2023-07-26 ENCOUNTER — Telehealth (INDEPENDENT_AMBULATORY_CARE_PROVIDER_SITE_OTHER): Payer: Self-pay | Admitting: Gastroenterology

## 2023-07-26 NOTE — Telephone Encounter (Signed)
Reminder File 3 months ago  Marlowe Shores, LPN  Marlowe Shores, LPN Patient needs to have a repeat EGD in 3 months

## 2023-07-27 NOTE — Telephone Encounter (Signed)
Pt contacted. Pt states he is not ready to schedule EGD due to he is needing to get recertified for his insurance. Advised pt to call back when ready. Pt verbalized understanding.

## 2023-07-27 NOTE — Telephone Encounter (Signed)
Thanks

## 2023-08-04 ENCOUNTER — Ambulatory Visit
Admission: EM | Admit: 2023-08-04 | Discharge: 2023-08-04 | Disposition: A | Discharge status: Home / Self Care | Payer: 59

## 2023-08-04 ENCOUNTER — Encounter: Payer: Self-pay | Admitting: Internal Medicine

## 2023-08-04 ENCOUNTER — Ambulatory Visit: Payer: 59 | Attending: Internal Medicine | Admitting: Internal Medicine

## 2023-08-04 ENCOUNTER — Telehealth: Payer: Self-pay | Admitting: Internal Medicine

## 2023-08-04 VITALS — BP 114/86 | HR 114 | Ht 69.0 in | Wt 200.0 lb

## 2023-08-04 DIAGNOSIS — R29898 Other symptoms and signs involving the musculoskeletal system: Secondary | ICD-10-CM | POA: Diagnosis not present

## 2023-08-04 DIAGNOSIS — Z136 Encounter for screening for cardiovascular disorders: Secondary | ICD-10-CM

## 2023-08-04 DIAGNOSIS — J069 Acute upper respiratory infection, unspecified: Secondary | ICD-10-CM | POA: Diagnosis not present

## 2023-08-04 DIAGNOSIS — R931 Abnormal findings on diagnostic imaging of heart and coronary circulation: Secondary | ICD-10-CM | POA: Diagnosis not present

## 2023-08-04 DIAGNOSIS — I739 Peripheral vascular disease, unspecified: Secondary | ICD-10-CM | POA: Diagnosis not present

## 2023-08-04 NOTE — ED Provider Notes (Signed)
[facility]    CSN: [id number] Arrival date & time: 08/04/23  1613      History   Chief Complaint No chief complaint on file.   HPI Mason Patterson is a 62 y.o. male.   Patient presenting today with 4-day history of nasal congestion, sinus pressure, body aches, nausea, headaches, cough.  Denies chest pain, shortness of breath, fever, abdominal pain, vomiting, diarrhea.  So far not tried anything over-the-counter for symptoms.  Took some home COVID test that were negative.  Multiple sick contacts recently.    Past Medical History:  Diagnosis Date   Acute medial meniscal injury of right knee    Arthritis    Hyperlipidemia    Hypertension     Patient Active Problem List   Diagnosis Date Noted   Leg heaviness 08/04/2023   Elevated coronary artery calcium score 08/04/2023   History of colonic polyps 03/30/2023   Dysphagia 01/31/2023   HTN, goal below 130/80 12/28/2022   HLD (hyperlipidemia) 12/28/2022   Screening for cardiovascular condition 12/28/2022   Rapid resting heart rate 12/28/2022   GERD (gastroesophageal reflux disease) 04/15/2022   Family history of colon cancer 04/15/2022   Bloating 04/15/2022   BPH with obstruction/lower urinary tract symptoms 04/15/2022   Organic impotence 04/15/2022    Past Surgical History:  Procedure Laterality Date   BIOPSY  03/30/2023   Procedure: BIOPSY;  Surgeon: Dolores Frame, MD;  [facility];  Service: Gastroenterology;;   CHONDROPLASTY Right 08/14/2015   Procedure: CHONDROPLASTY;  Surgeon: Marcene Corning, MD;  Location: [facility];  Service: Orthopedics;  Laterality: Right;   COLONOSCOPY WITH PROPOFOL N/A 03/30/2023   Procedure: COLONOSCOPY WITH PROPOFOL;  Surgeon: Dolores Frame, MD;  [facility];  Service: Gastroenterology;  Laterality: N/A;  1:00pm asa 1-2   ESOPHAGOGASTRODUODENOSCOPY (EGD) WITH PROPOFOL N/A 03/30/2023   Procedure:  ESOPHAGOGASTRODUODENOSCOPY (EGD) WITH PROPOFOL;  Surgeon: Dolores Frame, MD;  [facility];  Service: Gastroenterology;  Laterality: N/A;  1:00pm;asa 1-2   KNEE ARTHROSCOPY WITH MEDIAL MENISECTOMY Right 08/14/2015   Procedure: KNEE ARTHROSCOPY WITH MEDIAL AND LATERAL MENISECTOMY;  Surgeon: Marcene Corning, MD;  Location: [facility];  Service: Orthopedics;  Laterality: Right;   KNEE SURGERY Left 2017   POLYPECTOMY  03/30/2023   Procedure: POLYPECTOMY;  Surgeon: Dolores Frame, MD;  [facility];  Service: Gastroenterology;;   Gaspar Bidding DILATION  03/30/2023   Procedure: Gaspar Bidding DILATION;  Surgeon: Marguerita Merles, Reuel Boom, MD;  [facility];  Service: Gastroenterology;;       Home Medications    Prior to Admission medications   Medication Sig Start Date End Date Taking? Authorizing Provider  glipiZIDE (GLUCOTROL XL) 10 MG 24 hr tablet Take 10 mg by mouth daily. 07/17/23   [provider]  losartan (COZAAR) 100 MG tablet Take 100 mg by mouth every evening. 05/03/20   [provider]  omeprazole (PRILOSEC) 40 MG capsule Take 40 mg by mouth as needed.    [provider]    Family History Family History  Problem Relation Age of Onset   Diabetes Mother    Cancer Brother    Cancer Brother     Social History Social History   Tobacco Use   Smoking status: Never    Passive exposure: Never   Smokeless tobacco: Never  Vaping Use   Vaping status: Never Used  Substance Use Topics   Alcohol use: Yes  Comment: occasionally   Drug use: No     Allergies   Patient has no known allergies.   Review of Systems Review of Systems Per HPI  Physical Exam Triage Vital Signs ED Triage Vitals  Encounter Vitals Group     BP 08/04/23 1658 105/65     Systolic BP Percentile --      Diastolic BP Percentile --      Pulse Rate 08/04/23 1658 (!) 108     Resp 08/04/23 1658 18     Temp 08/04/23 1658 (!)  97.5 F (36.4 C)     Temp Source 08/04/23 1658 Oral     SpO2 08/04/23 1658 97 %     Weight --      Height --      Head Circumference --      Peak Flow --      Pain Score 08/04/23 1700 0     Pain Loc --      Pain Education --      Exclude from Growth Chart --    No data found.  Updated Vital Signs BP 105/65 (BP Location: Right Arm)   Pulse (!) 108   Temp (!) 97.5 F (36.4 C) (Oral)   Resp 18   SpO2 97%   Visual Acuity Right Eye Distance:   Left Eye Distance:   Bilateral Distance:    Right Eye Near:   Left Eye Near:    Bilateral Near:     Physical Exam Vitals and nursing note reviewed.  Constitutional:      Appearance: He is well-developed.  HENT:     Head: Atraumatic.     Right Ear: External ear normal.     Left Ear: External ear normal.     Nose: Rhinorrhea present.     Mouth/Throat:     Pharynx: Posterior oropharyngeal erythema present. No oropharyngeal exudate.  Eyes:     Conjunctiva/sclera: Conjunctivae normal.     Pupils: Pupils are equal, round, and reactive to light.  Cardiovascular:     Rate and Rhythm: Normal rate and regular rhythm.  Pulmonary:     Effort: Pulmonary effort is normal. No respiratory distress.     Breath sounds: No wheezing or rales.  Musculoskeletal:        General: Normal range of motion.     Cervical back: Normal range of motion and neck supple.  Lymphadenopathy:     Cervical: No cervical adenopathy.  Skin:    General: Skin is warm and dry.  Neurological:     Mental Status: He is alert and oriented to person, place, and time.  Psychiatric:        Behavior: Behavior normal.      UC Treatments / Results  Labs (all labs ordered are listed, but only abnormal results are displayed) Labs Reviewed - No data to display  EKG   Radiology No results found.  Procedures Procedures (including critical care time)  Medications Ordered in UC Medications - No data to display  Initial Impression / Assessment and Plan / UC  Course  I have reviewed the triage vital signs and the nursing notes.  Pertinent labs & imaging results that were available during my care of the patient were reviewed by me and considered in my medical decision making (see chart for details).     Mildly tachycardic in triage, otherwise vital signs within normal limits.  Suspect viral respiratory infection.  Declines COVID testing.  Discussed supportive over-the-counter medications, home care.  Declines prescription medications today for symptoms.  Work note given.  Final Clinical Impressions(s) / UC Diagnoses   Final diagnoses:  Viral URI   Discharge Instructions   None    ED Prescriptions   None    PDMP not reviewed this encounter.   Particia Nearing, [place] 08/04/23 1753

## 2023-08-04 NOTE — Telephone Encounter (Signed)
Patient will call the office back to schedule his 3 month fu and ABI after he checks his schedule.

## 2023-08-04 NOTE — ED Triage Notes (Signed)
Pt reports he has sinus pressure, body aches, nausea , and headache x 4 days   Took 3 covid test but all neg

## 2023-08-04 NOTE — Patient Instructions (Addendum)
Medication Instructions:  Your physician has recommended you make the following change in your medication:  Stop taking Chlorthalidone Continue taking all other medications as prescribed   Labwork: None  Testing/Procedures: Your physician has requested that you have an ankle brachial index (ABI). During this test an ultrasound and blood pressure cuff are used to evaluate the arteries that supply the arms and legs with blood. Allow thirty minutes for this exam. There are no restrictions or special instructions.   Follow-Up: Your physician recommends that you schedule a follow-up appointment in: 3 months  Any Other Special Instructions Will Be Listed Below (If Applicable).  If you need a refill on your cardiac medications before your next appointment, please call your pharmacy.

## 2023-08-04 NOTE — Progress Notes (Signed)
Cardiology Office Note  Date: 08/04/2023   ID: Mason Patterson, DOB 09/03/1961, MRN [id number]  PCP:  Assunta Found, MD  Cardiologist:  Marjo Bicker, MD Electrophysiologist:  None    History of Present Illness: Mason Patterson is a 62 y.o. male known to have HTN, DM2, HLD is here for follow-up visit.  CT calcium scoring of coronaries in 12/2022 showed coronary calcium score of 232 which is 92nd percentile for age and sex matched control. He was previously on rosuvastatin 10 mg nightly which had to be self discontinued due to myalgias. I started him on chlorthalidone for better HTN control but he started to experience leg heaviness especially with exertion.  He did not have the symptoms prior.  Otherwise, no angina, DOE, orthopnea, PND, palpitations, dizziness, syncope or leg swelling.  Denies smoking cigarettes.  Past Medical History:  Diagnosis Date   Acute medial meniscal injury of right knee    Arthritis    Hyperlipidemia    Hypertension     Past Surgical History:  Procedure Laterality Date   BIOPSY  03/30/2023   Procedure: BIOPSY;  Surgeon: Dolores Frame, MD;  [facility];  Service: Gastroenterology;;   CHONDROPLASTY Right 08/14/2015   Procedure: CHONDROPLASTY;  Surgeon: Marcene Corning, MD;  Location: [facility];  Service: Orthopedics;  Laterality: Right;   COLONOSCOPY WITH PROPOFOL N/A 03/30/2023   Procedure: COLONOSCOPY WITH PROPOFOL;  Surgeon: Dolores Frame, MD;  [facility];  Service: Gastroenterology;  Laterality: N/A;  1:00pm asa 1-2   ESOPHAGOGASTRODUODENOSCOPY (EGD) WITH PROPOFOL N/A 03/30/2023   Procedure: ESOPHAGOGASTRODUODENOSCOPY (EGD) WITH PROPOFOL;  Surgeon: Dolores Frame, MD;  [facility];  Service: Gastroenterology;  Laterality: N/A;  1:00pm;asa 1-2   KNEE ARTHROSCOPY WITH MEDIAL MENISECTOMY Right 08/14/2015   Procedure: KNEE ARTHROSCOPY WITH MEDIAL AND LATERAL  MENISECTOMY;  Surgeon: Marcene Corning, MD;  Location: [facility];  Service: Orthopedics;  Laterality: Right;   KNEE SURGERY Left 2017   POLYPECTOMY  03/30/2023   Procedure: POLYPECTOMY;  Surgeon: Dolores Frame, MD;  [facility];  Service: Gastroenterology;;   Gaspar Bidding DILATION  03/30/2023   Procedure: Gaspar Bidding DILATION;  Surgeon: Marguerita Merles, Reuel Boom, MD;  [facility];  Service: Gastroenterology;;    Current Outpatient Medications  Medication Sig Dispense Refill   chlorthalidone (HYGROTON) 25 MG tablet Take 1 tablet by mouth once daily 30 tablet 2   losartan (COZAAR) 100 MG tablet Take 100 mg by mouth every evening.     omeprazole (PRILOSEC) 40 MG capsule Take 1 capsule (40 mg total) by mouth daily. 90 capsule 3   No current facility-administered medications for this visit.   Allergies:  Patient has no known allergies.   Social History: The patient  reports that he has never smoked. He has never been exposed to tobacco smoke. He has never used smokeless tobacco. He reports current alcohol use. He reports that he does not use drugs.   Family History: The patient's family history includes Cancer in his brother and brother; Diabetes in his mother.   ROS:  Please see the history of present illness. Otherwise, complete review of systems is positive for none.  All other systems are reviewed and negative.   Physical Exam: VS:  There were no vitals taken for this visit., BMI There is no height or weight on file to calculate BMI.  Wt Readings from Last 3 Encounters:  05/23/23 200 lb 4.8 oz (  90.9 kg)  03/30/23 193 lb (87.5 kg)  01/27/23 198 lb 11.2 oz (90.1 kg)    General: Patient appears comfortable at rest. HEENT: Conjunctiva and lids normal, oropharynx clear with moist mucosa. Neck: Supple, no elevated JVP or carotid bruits, no thyromegaly. Lungs: Clear to auscultation, nonlabored breathing at rest. Cardiac: Regular rate and rhythm, no S3  or significant systolic murmur, no pericardial rub. Abdomen: Soft, nontender, no hepatomegaly, bowel sounds present, no guarding or rebound. Extremities: No pitting edema, distal pulses 2+. Skin: Warm and dry. Musculoskeletal: No kyphosis. Neuropsychiatric: Alert and oriented x3, affect grossly appropriate.  ECG:  An ECG dated 12/28/2022 was personally reviewed today and demonstrated:  Normal sinus rhythm, Q waves in inferior leads, LVH with repolarization abnormality.  Recent Labwork: 03/24/2023: BUN 25; Creatinine, Ser 0.97; Potassium 4.2; Sodium 135  No results found for: "CHOL", "TRIG", "HDL", "CHOLHDL", "VLDL", "LDLCALC", "LDLDIRECT"    Assessment and Plan: Patient is a 62 year old M known to have elevated coronary calcium score, HTN, DM2, HLD is here for follow-up visit.  Elevated coronary calcium score 232 (92nd percentile for age and sex matched control): He was previously on rosuvastatin 10 mg which he did not tolerate due to myalgias.  He continues to have leg heaviness especially with exertion, will reevaluate his symptoms in 3 months and discuss about initiating atorvastatin provided leg heaviness resolved.  No angina or DOE.  No indication of stress testing at this time.  Sinus tachycardia, HR 114 bpm: No palpitations.  Will curbside with electrophysiology to see if there is any benefit to treat asymptomatic sinus tachycardia.  Bilateral leg heaviness: Bilateral leg heaviness with exertion started after initiate chlorthalidone.  Discontinue chlorthalidone and obtain ABI.  HTN, controlled: Continue losartan 100 mg once daily, stop chlorthalidone as stated above.  HLD, unknown values: He was previously on rosuvastatin which he did not tolerate due to myalgias.  Will discuss about initiating atorvastatin in the next clinic visit provided his leg heaviness resolved at the time.   Disposition:  Follow up 3 months  Signed Polette Nofsinger Verne Spurr, MD, 08/04/2023 2:50 PM    [facility] [address] [place], [place], [place] [address]

## 2023-08-05 NOTE — Patient Instructions (Signed)

## 2023-08-09 ENCOUNTER — Encounter: Payer: Self-pay | Admitting: Nurse Practitioner

## 2023-08-09 ENCOUNTER — Ambulatory Visit: Payer: 59 | Admitting: Nurse Practitioner

## 2023-08-09 VITALS — BP 107/72 | HR 91 | Ht 69.0 in | Wt 204.2 lb

## 2023-08-09 DIAGNOSIS — E782 Mixed hyperlipidemia: Secondary | ICD-10-CM

## 2023-08-09 DIAGNOSIS — E1165 Type 2 diabetes mellitus with hyperglycemia: Secondary | ICD-10-CM | POA: Diagnosis not present

## 2023-08-09 DIAGNOSIS — Z794 Long term (current) use of insulin: Secondary | ICD-10-CM

## 2023-08-09 DIAGNOSIS — Z7984 Long term (current) use of oral hypoglycemic drugs: Secondary | ICD-10-CM

## 2023-08-09 DIAGNOSIS — I10 Essential (primary) hypertension: Secondary | ICD-10-CM | POA: Diagnosis not present

## 2023-08-09 MED ORDER — ACCU-CHEK SOFTCLIX LANCETS MISC: 12 refills | Status: AC

## 2023-08-09 MED ORDER — ACCU-CHEK GUIDE VI STRP: ORAL_STRIP | 12 refills | Status: AC

## 2023-08-09 MED ORDER — GLIPIZIDE ER 5 MG PO TB24: 5.0000 mg | ORAL_TABLET | Freq: Every day | ORAL | 1 refills | Status: AC

## 2023-08-09 NOTE — Progress Notes (Signed)
Endocrinology Consult Note       08/09/2023, 2:58 PM   Subjective:    Patient ID: Mason Patterson, male    DOB: 1960-11-25.  NORMON RADFORD is being seen in consultation for management of currently uncontrolled symptomatic diabetes requested by  Assunta Found, MD.   Past Medical History:  Diagnosis Date   Acute medial meniscal injury of right knee    Arthritis    Hyperlipidemia    Hypertension     Past Surgical History:  Procedure Laterality Date   BIOPSY  03/30/2023   Procedure: BIOPSY;  Surgeon: Dolores Frame, MD;  [facility];  Service: Gastroenterology;;   CHONDROPLASTY Right 08/14/2015   Procedure: CHONDROPLASTY;  Surgeon: Marcene Corning, MD;  Location: [facility];  Service: Orthopedics;  Laterality: Right;   COLONOSCOPY WITH PROPOFOL N/A 03/30/2023   Procedure: COLONOSCOPY WITH PROPOFOL;  Surgeon: Dolores Frame, MD;  [facility];  Service: Gastroenterology;  Laterality: N/A;  1:00pm asa 1-2   ESOPHAGOGASTRODUODENOSCOPY (EGD) WITH PROPOFOL N/A 03/30/2023   Procedure: ESOPHAGOGASTRODUODENOSCOPY (EGD) WITH PROPOFOL;  Surgeon: Dolores Frame, MD;  [facility];  Service: Gastroenterology;  Laterality: N/A;  1:00pm;asa 1-2   KNEE ARTHROSCOPY WITH MEDIAL MENISECTOMY Right 08/14/2015   Procedure: KNEE ARTHROSCOPY WITH MEDIAL AND LATERAL MENISECTOMY;  Surgeon: Marcene Corning, MD;  Location: [facility];  Service: Orthopedics;  Laterality: Right;   KNEE SURGERY Left 2017   POLYPECTOMY  03/30/2023   Procedure: POLYPECTOMY;  Surgeon: Marguerita Merles, Reuel Boom, MD;  [facility];  Service: Gastroenterology;;   Gaspar Bidding DILATION  03/30/2023   Procedure: Gaspar Bidding DILATION;  Surgeon: Marguerita Merles, Reuel Boom, MD;  [facility];  Service: Gastroenterology;;    Social History   Socioeconomic History    Marital status: Single    Spouse name: Not on file   Number of children: Not on file   Years of education: Not on file   Highest education level: Not on file  Occupational History   Not on file  Tobacco Use   Smoking status: Never    Passive exposure: Never   Smokeless tobacco: Never  Vaping Use   Vaping status: Never Used  Substance and Sexual Activity   Alcohol use: Yes    Comment: occasionally   Drug use: No   Sexual activity: Yes    Birth control/protection: None  Other Topics Concern   Not on file  Social History Narrative   Not on file   Social Determinants of Health   Financial Resource Strain: Not on file  Food Insecurity: Not on file  Transportation Needs: Not on file  Physical Activity: Not on file  Stress: Not on file  Social Connections: Not on file    Family History  Problem Relation Age of Onset   Diabetes Mother    Cancer Brother    Cancer Brother     Outpatient Encounter Medications as of 08/09/2023  Medication Sig   Accu-Chek Softclix Lancets lancets Use as instructed to monitor glucose twice daily   glipiZIDE (GLUCOTROL XL) 5 MG 24 hr tablet Take 1 tablet (5 mg total) by mouth daily with breakfast.  glucose blood (ACCU-CHEK GUIDE) test strip Use as instructed to monitor glucose twice daily   losartan (COZAAR) 100 MG tablet Take 100 mg by mouth every evening.   omeprazole (PRILOSEC) 40 MG capsule Take 40 mg by mouth as needed.   [DISCONTINUED] glipiZIDE (GLUCOTROL XL) 10 MG 24 hr tablet Take 10 mg by mouth daily.   No facility-administered encounter medications on file as of 08/09/2023.    ALLERGIES: No Known Allergies  VACCINATION STATUS:  There is no immunization history on file for this patient.  Diabetes He presents for his initial diabetic visit. He has type 2 diabetes mellitus. Onset time: rediagnosed about a year ago (was previously in remission) His disease course has been stable. There are no hypoglycemic associated symptoms. There  are no hypoglycemic complications. Diabetic complications include heart disease. Risk factors for coronary artery disease include diabetes mellitus, dyslipidemia, male sex, family history, obesity and hypertension. Current diabetic treatment includes oral agent (monotherapy). He is compliant with treatment most of the time. His weight is fluctuating minimally. He is following a generally healthy diet. When asked about meal planning, he reported none. He has not had a previous visit with a dietitian. He participates in exercise daily. (He presents today for his consultation with no meter or logs to review.  He does not monitor glucose at home, says his meter was recently recalled.  His most recent A1c on 7/1 was 8%.  He notes he was on a steroid taper prior to that.  He drinks mostly water  (does flavor with SF lemon flavoring packet at times).  He tends to only eat 1 meal per day and a small snack.  He exercises daily.  He is overdue for eye exam, and is already established with a podiatrist.) An ACE inhibitor/angiotensin II receptor blocker is being taken. He sees a podiatrist.Eye exam is current.     Review of systems  Constitutional: + Minimally fluctuating body weight, current Body mass index is 30.16 kg/m., no fatigue, no subjective hyperthermia, no subjective hypothermia Eyes: no blurry vision, no xerophthalmia ENT: no sore throat, no nodules palpated in throat, no dysphagia/odynophagia, no hoarseness Cardiovascular: no chest pain, no shortness of breath, no palpitations, no leg swelling Respiratory: no cough, no shortness of breath Gastrointestinal: no nausea/vomiting/diarrhea Musculoskeletal: no muscle/joint aches Skin: no rashes, no hyperemia Neurological: no tremors, no numbness, no tingling, no dizziness Psychiatric: no depression, no anxiety  Objective:     BP 107/72 (BP Location: Left Arm, Patient Position: Sitting, Cuff Size: Large)   Pulse 91   Ht 5\' 9"  (1.753 m)   Wt 204 lb  3.2 oz (92.6 kg)   BMI 30.16 kg/m   Wt Readings from Last 3 Encounters:  08/09/23 204 lb 3.2 oz (92.6 kg)  08/04/23 200 lb (90.7 kg)  05/23/23 200 lb 4.8 oz (90.9 kg)     BP Readings from Last 3 Encounters:  08/09/23 107/72  08/04/23 105/65  08/04/23 114/86     Physical Exam- Limited  Constitutional:  Body mass index is 30.16 kg/m. , not in acute distress, normal state of mind Eyes:  EOMI, no exophthalmos Neck: Supple Cardiovascular: RRR, no murmurs, rubs, or gallops, no edema Respiratory: Adequate breathing efforts, no crackles, rales, rhonchi, or wheezing Musculoskeletal: no gross deformities, strength intact in all four extremities, no gross restriction of joint movements Skin:  no rashes, no hyperemia Neurological: no tremor with outstretched hands   Diabetic Foot Exam - Simple   No data filed  CMP ( most recent) CMP     Component Value Date/Time   NA 135 03/24/2023 1218   NA 137 01/03/2023 1124   K 4.2 03/24/2023 1218   CL 101 03/24/2023 1218   CO2 25 03/24/2023 1218   GLUCOSE 129 (H) 03/24/2023 1218   BUN 25 (H) 03/24/2023 1218   BUN 12 01/03/2023 1124   CREATININE 0.97 03/24/2023 1218   CALCIUM 9.3 03/24/2023 1218   PROT 6.9 03/23/2018 0750   ALBUMIN 3.9 03/23/2018 0750   AST 20 03/23/2018 0750   ALT 23 03/23/2018 0750   ALKPHOS 81 03/23/2018 0750   BILITOT 2.0 (H) 03/23/2018 0750   EGFR 98 01/03/2023 1124   GFRNONAA >60 03/24/2023 1218     Diabetic Labs (most recent): Lab Results  Component Value Date   HGBA1C 8 05/23/2023     Lipid Panel ( most recent) Lipid Panel     Component Value Date/Time   TRIG 150 10/01/2022 0000   LDLCALC 182 10/01/2022 0000      Lab Results  Component Value Date   TSH 1.26 10/01/2022           Assessment & Plan:   1) Type 2 diabetes mellitus with hyperglycemia, with long-term current use of insulin (HCC)  He presents today for his consultation with no meter or logs to review.  He does not  monitor glucose at home, says his meter was recently recalled.  His most recent A1c on 7/1 was 8%.  He notes he was on a steroid taper prior to that.  He drinks mostly water  (does flavor with SF lemon flavoring packet at times).  He tends to only eat 1 meal per day and a small snack.  He exercises daily.  He is overdue for eye exam, and is already established with a podiatrist.  - HOBIE ANCIRA has currently uncontrolled symptomatic type 2 DM since 62 years of age, with most recent A1c of 8 %.   -Recent labs reviewed.  - I had a long discussion with him about the progressive nature of diabetes and the pathology behind its complications. -his diabetes is complicated by CAD and he remains at a high risk for more acute and chronic complications which include CAD, CVA, CKD, retinopathy, and neuropathy. These are all discussed in detail with him.  The following Lifestyle Medicine recommendations according to American College of Lifestyle Medicine [facility]) were discussed and offered to patient and he agrees to start the journey:  A. Whole Foods, Plant-based plate comprising of fruits and vegetables, plant-based proteins, whole-grain carbohydrates was discussed in detail with the patient.   A list for source of those nutrients were also provided to the patient.  Patient will use only water or unsweetened tea for hydration. B.  The need to stay away from risky substances including alcohol, smoking; obtaining 7 to 9 hours of restorative sleep, at least 150 minutes of moderate intensity exercise weekly, the importance of healthy social connections,  and stress reduction techniques were discussed. C.  A full color page of  Calorie density of various food groups per pound showing examples of each food groups was provided to the patient.  - I have counseled him on diet and weight management by adopting a carbohydrate restricted/protein rich diet. Patient is encouraged to switch to unprocessed or minimally  processed complex starch and increased protein intake (animal or plant source), fruits, and vegetables. -  he is advised to stick to a routine mealtimes to eat 3  meals a day and avoid unnecessary snacks (to snack only to correct hypoglycemia).   - he acknowledges that there is a room for improvement in his food and drink choices. - Suggestion is made for him to avoid simple carbohydrates from his diet including Cakes, Sweet Desserts, Ice Cream, Soda (diet and regular), Sweet Tea, Candies, Chips, Cookies, Store Bought Juices, Alcohol in Excess of 1-2 drinks a day, Artificial Sweeteners, Coffee Creamer, and "Sugar-free" Products. This will help patient to have more stable blood glucose profile and potentially avoid unintended weight gain.   - I have approached him with the following individualized plan to manage his diabetes and patient agrees:   He is advised to lower his dose of Glipizide to 5 mg XL daily with breakfast, a safer dose to avoid hypoglycemia.  He is motivated to come off all medications, has been able to put his diabetes in remission in the past.  I believe he will do well with lifestyle modifications alone.  -he is encouraged to start monitoring glucose 2 times daily, before breakfast and before bed, to log their readings on the clinic sheets provided, and bring them to review at follow up appointment in 3 months.  I did give him sample accu check meter today.  - Adjustment parameters are given to him for hypo and hyperglycemia in writing. - he is encouraged to call clinic for blood glucose levels less than 70 or above 300 mg /dl.  He did not tolerate Metformin in the past.  - Specific targets for  A1c; LDL, HDL, and Triglycerides were discussed with the patient.  2) Blood Pressure /Hypertension:  his blood pressure is controlled to target.   he is advised to continue his current medications including Losartan 100 mg p.o. daily with breakfast.  3) Lipids/Hyperlipidemia:     Review of his recent lipid panel from 10/01/22 showed uncontrolled LDL at 182 . He has documented statin intolerance.  4)  Weight/Diet:  his Body mass index is 30.16 kg/m.  -  clearly complicating his diabetes care.   he is a candidate for weight loss. I discussed with him the fact that loss of 5 - 10% of his  current body weight will have the most impact on his diabetes management.  Exercise, and detailed carbohydrates information provided  -  detailed on discharge instructions.  5) Chronic Care/Health Maintenance: -he is on ACEI/ARB and not Statin medications (intolerant) and is encouraged to initiate and continue to follow up with Ophthalmology, Dentist, Podiatrist at least yearly or according to recommendations, and advised to stay away from smoking. I have recommended yearly flu vaccine and pneumonia vaccine at least every 5 years; moderate intensity exercise for up to 150 minutes weekly; and sleep for at least 7 hours a day.  - he is advised to maintain close follow up with Assunta Found, MD for primary care needs, as well as his other providers for optimal and coordinated care.   - Time spent in this patient care: 60 min, of which > 50% was spent in counseling him about his diabetes and the rest reviewing his blood glucose logs, discussing his hypoglycemia and hyperglycemia episodes, reviewing his current and previous labs/studies (including abstraction from other facilities) and medications doses and developing a long term treatment plan based on the latest standards of care/guidelines; and documenting his care.    Please refer to Patient Instructions for Blood Glucose Monitoring and Insulin/Medications Dosing Guide" in media tab for additional information. Please also refer to "Patient  Self Inventory" in the Media tab for reviewed elements of pertinent patient history.  Edmonia Lynch participated in the discussions, expressed understanding, and voiced agreement with the above plans.   All questions were answered to his satisfaction. he is encouraged to contact clinic should he have any questions or concerns prior to his return visit.     Follow up plan: - Return in about 3 months (around 11/08/2023) for Diabetes F/U with A1c in office, No previsit labs, Bring meter and logs.    Ronny Bacon, [facility] [address] [place], [place] [address] Phone: [phone] Fax: [phone]  08/09/2023, 2:58 PM

## 2023-08-11 ENCOUNTER — Other Ambulatory Visit: Payer: Self-pay

## 2023-08-11 ENCOUNTER — Encounter: Payer: Self-pay | Admitting: Emergency Medicine

## 2023-08-11 ENCOUNTER — Ambulatory Visit
Admission: EM | Admit: 2023-08-11 | Discharge: 2023-08-11 | Disposition: A | Discharge status: Home / Self Care | Payer: 59 | Attending: Nurse Practitioner | Admitting: Nurse Practitioner

## 2023-08-11 DIAGNOSIS — Z1152 Encounter for screening for COVID-19: Secondary | ICD-10-CM | POA: Diagnosis not present

## 2023-08-11 DIAGNOSIS — Z0289 Encounter for other administrative examinations: Secondary | ICD-10-CM | POA: Insufficient documentation

## 2023-08-11 NOTE — ED Provider Notes (Signed)
[facility]    CSN: [id number] Arrival date & time: 08/11/23  1535      History   Chief Complaint No chief complaint on file.   HPI Mason Patterson is a 62 y.o. male.   The history is provided by the patient.   Patient presents for repeat COVID testing as he states his job is requiring that he can return when he is COVID free.  Patient continues to report loss of taste and intermittent dizziness.  He denies fever, chills, headache, nasal congestion, runny nose, cough, chest pain, abdominal pain, nausea, vomiting, or diarrhea.  Patient was seen in this clinic on 08/04/2023 and provided a work note for 3 days.  Patient states when he was tested here, his COVID test was negative.  He states he tested at home 3 days later and his COVID test was positive.  Patient reports he has not since returned to work since he was seen at that time.  Past Medical History:  Diagnosis Date   Acute medial meniscal injury of right knee    Arthritis    Hyperlipidemia    Hypertension     Patient Active Problem List   Diagnosis Date Noted   Leg heaviness 08/04/2023   Elevated coronary artery calcium score 08/04/2023   History of colonic polyps 03/30/2023   Dysphagia 01/31/2023   HTN, goal below 130/80 12/28/2022   HLD (hyperlipidemia) 12/28/2022   Screening for cardiovascular condition 12/28/2022   Rapid resting heart rate 12/28/2022   GERD (gastroesophageal reflux disease) 04/15/2022   Family history of colon cancer 04/15/2022   Bloating 04/15/2022   BPH with obstruction/lower urinary tract symptoms 04/15/2022   Organic impotence 04/15/2022    Past Surgical History:  Procedure Laterality Date   BIOPSY  03/30/2023   Procedure: BIOPSY;  Surgeon: Dolores Frame, MD;  Location: [facility];  Service: Gastroenterology;;   CHONDROPLASTY Right 08/14/2015   Procedure: CHONDROPLASTY;  Surgeon: Marcene Corning, MD;  Location: [facility];  Service: Orthopedics;   Laterality: Right;   COLONOSCOPY WITH PROPOFOL N/A 03/30/2023   Procedure: COLONOSCOPY WITH PROPOFOL;  Surgeon: Dolores Frame, MD;  Location: [facility];  Service: Gastroenterology;  Laterality: N/A;  1:00pm asa 1-2   ESOPHAGOGASTRODUODENOSCOPY (EGD) WITH PROPOFOL N/A 03/30/2023   Procedure: ESOPHAGOGASTRODUODENOSCOPY (EGD) WITH PROPOFOL;  Surgeon: Dolores Frame, MD;  Location: [facility];  Service: Gastroenterology;  Laterality: N/A;  1:00pm;asa 1-2   KNEE ARTHROSCOPY WITH MEDIAL MENISECTOMY Right 08/14/2015   Procedure: KNEE ARTHROSCOPY WITH MEDIAL AND LATERAL MENISECTOMY;  Surgeon: Marcene Corning, MD;  Location: [facility];  Service: Orthopedics;  Laterality: Right;   KNEE SURGERY Left 2017   POLYPECTOMY  03/30/2023   Procedure: POLYPECTOMY;  Surgeon: Dolores Frame, MD;  Location: [facility];  Service: Gastroenterology;;   Gaspar Bidding DILATION  03/30/2023   Procedure: Gaspar Bidding DILATION;  Surgeon: Marguerita Merles, Reuel Boom, MD;  Location: [facility];  Service: Gastroenterology;;       Home Medications    Prior to Admission medications   Medication Sig Start Date End Date Taking? Authorizing Provider  Accu-Chek Softclix Lancets lancets Use as instructed to monitor glucose twice daily 08/09/23   Dani Gobble, NP  glipiZIDE (GLUCOTROL XL) 5 MG 24 hr tablet Take 1 tablet (5 mg total) by mouth daily with breakfast. 08/09/23   Dani Gobble, NP  glucose blood (ACCU-CHEK GUIDE) test strip Use as instructed to monitor glucose twice daily 08/09/23  Dani Gobble, NP  losartan (COZAAR) 100 MG tablet Take 100 mg by mouth every evening. 05/03/20   [provider]  omeprazole (PRILOSEC) 40 MG capsule Take 40 mg by mouth as needed.    [provider]    Family History Family History  Problem Relation Age of Onset   Diabetes Mother    Cancer Brother    Cancer Brother     Social History Social History   Tobacco  Use   Smoking status: Never    Passive exposure: Never   Smokeless tobacco: Never  Vaping Use   Vaping status: Never Used  Substance Use Topics   Alcohol use: Yes    Comment: occasionally   Drug use: No     Allergies   Patient has no known allergies.   Review of Systems Review of Systems Per HPI  Physical Exam Triage Vital Signs ED Triage Vitals  Encounter Vitals Group     BP 08/11/23 1542 (!) 149/84     Systolic BP Percentile --      Diastolic BP Percentile --      Pulse Rate 08/11/23 1542 (!) 101     Resp 08/11/23 1542 18     Temp 08/11/23 1542 99.2 F (37.3 C)     Temp Source 08/11/23 1542 Oral     SpO2 08/11/23 1542 97 %     Weight --      Height --      Head Circumference --      Peak Flow --      Pain Score 08/11/23 1544 0     Pain Loc --      Pain Education --      Exclude from Growth Chart --    No data found.  Updated Vital Signs BP (!) 149/84 (BP Location: Right Arm)   Pulse (!) 101   Temp 99.2 F (37.3 C) (Oral)   Resp 18   SpO2 97%   Visual Acuity Right Eye Distance:   Left Eye Distance:   Bilateral Distance:    Right Eye Near:   Left Eye Near:    Bilateral Near:     Physical Exam Vitals and nursing note reviewed.  Constitutional:      General: He is not in acute distress.    Appearance: Normal appearance.  HENT:     Head: Normocephalic.     Right Ear: Tympanic membrane, ear canal and external ear normal.     Left Ear: Tympanic membrane, ear canal and external ear normal.     Mouth/Throat:     Mouth: Mucous membranes are moist.  Eyes:     Extraocular Movements: Extraocular movements intact.     Pupils: Pupils are equal, round, and reactive to light.  Cardiovascular:     Rate and Rhythm: Normal rate and regular rhythm.     Pulses: Normal pulses.     Heart sounds: Normal heart sounds.  Pulmonary:     Effort: Pulmonary effort is normal.     Breath sounds: Normal breath sounds.  Abdominal:     General: Bowel sounds are  normal.     Palpations: Abdomen is soft.     Tenderness: There is no abdominal tenderness.  Musculoskeletal:     Cervical back: Normal range of motion.  Lymphadenopathy:     Cervical: No cervical adenopathy.  Neurological:     Mental Status: He is alert.      UC Treatments / Results  Labs (all labs  ordered are listed, but only abnormal results are displayed) Labs Reviewed - No data to display  EKG   Radiology No results found.  Procedures Procedures (including critical care time)  Medications Ordered in UC Medications - No data to display  Initial Impression / Assessment and Plan / UC Course  I have reviewed the triage vital signs and the nursing notes.  Pertinent labs & imaging results that were available during my care of the patient were reviewed by me and considered in my medical decision making (see chart for details).  The patient is well-appearing, he is in no acute distress, vital signs are stable.  Repeat COVID testing is pending.  Advised patient that this provider cannot state he is COVID free, but reiterated to patient that he can return to work or could have returned to work as long as he has been fever free for 24 hours with no medication.  Advised patient that a work note will be provided stating the same.  Patient was advised to follow-up with his primary care physician if symptoms continue to linger or if he needs additional documentation for work.  Patient is in agreement with this plan of care and verbalizes understanding.  All questions were answered.  Patient stable for discharge.  Final Clinical Impressions(s) / UC Diagnoses   Final diagnoses:  Encounter for screening for COVID-19  Encounter to obtain excuse from work     Discharge Instructions      COVID test is pending.  Results will be available on 08/12/2023.  You will also have access to results via MyChart. Continue to allow for plenty of rest and fluids. As discussed, if your symptoms  continue to linger, you will need to follow-up with your primary care physician. Follow-up as needed.     ED Prescriptions   None    PDMP not reviewed this encounter.   Abran Cantor, NP 08/11/23 [phone]

## 2023-08-11 NOTE — Discharge Instructions (Addendum)
COVID test is pending.  Results will be available on 08/12/2023.  You will also have access to results via MyChart. Continue to allow for plenty of rest and fluids. As discussed, if your symptoms continue to linger, you will need to follow-up with your primary care physician. Follow-up as needed.

## 2023-08-11 NOTE — ED Triage Notes (Signed)
Patient states he needs a work note to return to work that states he no longer has covid.  States he continues to feel dizzy and unable to taste.

## 2023-08-12 DIAGNOSIS — E6609 Other obesity due to excess calories: Secondary | ICD-10-CM | POA: Diagnosis not present

## 2023-08-12 DIAGNOSIS — U071 COVID-19: Secondary | ICD-10-CM | POA: Diagnosis not present

## 2023-08-12 DIAGNOSIS — Z6831 Body mass index (BMI) 31.0-31.9, adult: Secondary | ICD-10-CM | POA: Diagnosis not present

## 2023-08-12 LAB — SARS CORONAVIRUS 2 (TAT 6-24 HRS): SARS Coronavirus 2: NEGATIVE

## 2023-08-18 DIAGNOSIS — E6609 Other obesity due to excess calories: Secondary | ICD-10-CM | POA: Diagnosis not present

## 2023-08-18 DIAGNOSIS — U099 Post covid-19 condition, unspecified: Secondary | ICD-10-CM | POA: Diagnosis not present

## 2023-08-18 DIAGNOSIS — Z6831 Body mass index (BMI) 31.0-31.9, adult: Secondary | ICD-10-CM | POA: Diagnosis not present

## 2023-08-18 DIAGNOSIS — R42 Dizziness and giddiness: Secondary | ICD-10-CM | POA: Diagnosis not present

## 2023-09-15 ENCOUNTER — Ambulatory Visit: Payer: 59 | Attending: Internal Medicine

## 2023-09-15 DIAGNOSIS — I739 Peripheral vascular disease, unspecified: Secondary | ICD-10-CM | POA: Diagnosis not present

## 2023-09-15 LAB — VAS US ABI WITH/WO TBI
Left ABI: 1.13
Right ABI: 1.12

## 2023-10-07 DIAGNOSIS — R946 Abnormal results of thyroid function studies: Secondary | ICD-10-CM | POA: Diagnosis not present

## 2023-10-07 DIAGNOSIS — E6609 Other obesity due to excess calories: Secondary | ICD-10-CM | POA: Diagnosis not present

## 2023-10-07 DIAGNOSIS — Z0000 Encounter for general adult medical examination without abnormal findings: Secondary | ICD-10-CM | POA: Diagnosis not present

## 2023-10-07 DIAGNOSIS — E7849 Other hyperlipidemia: Secondary | ICD-10-CM | POA: Diagnosis not present

## 2023-10-07 DIAGNOSIS — I10 Essential (primary) hypertension: Secondary | ICD-10-CM | POA: Diagnosis not present

## 2023-10-07 DIAGNOSIS — Z1331 Encounter for screening for depression: Secondary | ICD-10-CM | POA: Diagnosis not present

## 2023-10-07 DIAGNOSIS — E782 Mixed hyperlipidemia: Secondary | ICD-10-CM | POA: Diagnosis not present

## 2023-10-07 DIAGNOSIS — Z6831 Body mass index (BMI) 31.0-31.9, adult: Secondary | ICD-10-CM | POA: Diagnosis not present

## 2023-10-07 DIAGNOSIS — R972 Elevated prostate specific antigen [PSA]: Secondary | ICD-10-CM | POA: Diagnosis not present

## 2023-10-07 DIAGNOSIS — E1165 Type 2 diabetes mellitus with hyperglycemia: Secondary | ICD-10-CM | POA: Diagnosis not present

## 2023-10-19 DIAGNOSIS — D485 Neoplasm of uncertain behavior of skin: Secondary | ICD-10-CM | POA: Diagnosis not present

## 2023-10-19 DIAGNOSIS — L738 Other specified follicular disorders: Secondary | ICD-10-CM | POA: Diagnosis not present

## 2023-10-19 DIAGNOSIS — D235 Other benign neoplasm of skin of trunk: Secondary | ICD-10-CM | POA: Diagnosis not present

## 2023-11-10 ENCOUNTER — Ambulatory Visit: Payer: 59 | Admitting: Nurse Practitioner

## 2024-06-27 ENCOUNTER — Other Ambulatory Visit: Payer: Self-pay | Admitting: Urology

## 2024-06-27 DIAGNOSIS — N529 Male erectile dysfunction, unspecified: Secondary | ICD-10-CM

## 2024-09-05 ENCOUNTER — Encounter (INDEPENDENT_AMBULATORY_CARE_PROVIDER_SITE_OTHER): Payer: Self-pay | Admitting: Gastroenterology

## 2024-10-29 ENCOUNTER — Telehealth: Payer: Self-pay | Admitting: Family Medicine

## 2024-10-29 NOTE — Telephone Encounter (Signed)
 Copied from CRM #[id number]. Topic: General - Other >> Oct 29, 2024  9:41 AM Delon DASEN wrote: Reason for CRM: returning call about appt for today but he never scheduled an appt.

## 2024-10-29 NOTE — Telephone Encounter (Signed)
 Not a [facility] patient
